# Patient Record
Sex: Female | Born: 1970 | Race: White | Hispanic: No | Marital: Married | Smoking: Never smoker
Health system: Southern US, Community
[De-identification: ages and names within clinical notes are randomized; demographics above are authoritative.]

## PROBLEM LIST (undated history)

## (undated) DIAGNOSIS — E669 Obesity, unspecified: Secondary | ICD-10-CM

## (undated) DIAGNOSIS — E039 Hypothyroidism, unspecified: Secondary | ICD-10-CM

## (undated) DIAGNOSIS — Z9889 Other specified postprocedural states: Secondary | ICD-10-CM

## (undated) DIAGNOSIS — Z789 Other specified health status: Secondary | ICD-10-CM

## (undated) DIAGNOSIS — G35 Multiple sclerosis: Secondary | ICD-10-CM

## (undated) HISTORY — PX: TUBAL LIGATION: SHX77

## (undated) HISTORY — PX: SHOULDER ARTHROSCOPY: SHX128

## (undated) HISTORY — PX: TONSILLECTOMY: SUR1361

## (undated) HISTORY — PX: BREAST BIOPSY: SHX20

## (undated) HISTORY — PX: THYROIDECTOMY: SHX17

---

## 2012-09-07 DIAGNOSIS — E041 Nontoxic single thyroid nodule: Secondary | ICD-10-CM | POA: Insufficient documentation

## 2012-10-04 DIAGNOSIS — R768 Other specified abnormal immunological findings in serum: Secondary | ICD-10-CM | POA: Insufficient documentation

## 2013-01-17 DIAGNOSIS — M792 Neuralgia and neuritis, unspecified: Secondary | ICD-10-CM | POA: Insufficient documentation

## 2013-07-11 DIAGNOSIS — N390 Urinary tract infection, site not specified: Secondary | ICD-10-CM | POA: Insufficient documentation

## 2013-07-11 DIAGNOSIS — N319 Neuromuscular dysfunction of bladder, unspecified: Secondary | ICD-10-CM | POA: Insufficient documentation

## 2013-07-11 HISTORY — DX: Urinary tract infection, site not specified: N39.0

## 2014-01-09 DIAGNOSIS — L80 Vitiligo: Secondary | ICD-10-CM | POA: Insufficient documentation

## 2014-05-21 DIAGNOSIS — J3801 Paralysis of vocal cords and larynx, unilateral: Secondary | ICD-10-CM | POA: Insufficient documentation

## 2015-07-22 DIAGNOSIS — R234 Changes in skin texture: Secondary | ICD-10-CM | POA: Insufficient documentation

## 2015-07-22 HISTORY — DX: Changes in skin texture: R23.4

## 2016-04-25 ENCOUNTER — Ambulatory Visit
Admission: EM | Admit: 2016-04-25 | Discharge: 2016-04-25 | Disposition: A | Discharge status: Home / Self Care | Payer: BLUE CROSS/BLUE SHIELD | Attending: Emergency Medicine | Admitting: Emergency Medicine

## 2016-04-25 DIAGNOSIS — J069 Acute upper respiratory infection, unspecified: Secondary | ICD-10-CM | POA: Diagnosis not present

## 2016-04-25 HISTORY — DX: Multiple sclerosis: G35

## 2016-04-25 HISTORY — DX: Hypothyroidism, unspecified: E03.9

## 2016-04-25 HISTORY — DX: Other specified postprocedural states: Z98.890

## 2016-04-25 HISTORY — DX: Obesity, unspecified: E66.9

## 2016-04-25 MED ORDER — AZITHROMYCIN 250 MG PO TABS: ORAL_TABLET | ORAL | 0 refills | Status: DC

## 2016-04-25 MED ORDER — BENZONATATE 200 MG PO CAPS: 200.0000 mg | ORAL_CAPSULE | Freq: Three times a day (TID) | ORAL | 0 refills | Status: DC

## 2016-04-25 NOTE — ED Provider Notes (Signed)
CSN: [id number]     Arrival date & time 04/25/16  0909 History   First MD Initiated Contact with Patient 04/25/16 1043     Chief Complaint  Patient presents with  . Cough  . Nasal Congestion  . Shortness of Breath   (Consider location/radiation/quality/duration/timing/severity/associated sxs/prior Treatment) HPI  This a 46 year old female who presents with a one-week history of cough and congestion. She denies any fever at all reaches subjective shortness of breath. O2 sats are 99% on room air. Respirations 20/m. Temperature 98 degrees pulse rate 89. History of multiple sclerosis A sem[facility] she has an illness she has normal increase in her muscle spasm and pain.      Past Medical History:  Diagnosis Date  . H/O shoulder surgery   . Hypothyroid   . Multiple sclerosis (HCC)   . Obese    Past Surgical History:  Procedure Laterality Date  . THYROIDECTOMY    . TONSILLECTOMY     History reviewed. No pertinent family history. Social History  Substance Use Topics  . Smoking status: Never Smoker  . Smokeless tobacco: Never Used  . Alcohol use Yes     Comment: social   OB History    No data available     Review of Systems  Constitutional: Positive for activity change and fatigue. Negative for chills and fever.  HENT: Positive for congestion, postnasal drip and rhinorrhea.   Respiratory: Positive for cough and shortness of breath. Negative for wheezing and stridor.   All other systems reviewed and are negative.   Allergies  Patient has no known allergies.  Home Medications   Prior to Admission medications   Medication Sig Start Date End Date Taking? Authorizing Provider  b complex vitamins capsule Take 1 capsule by mouth daily.   Yes Historical Provider, MD  calcium carbonate (OSCAL) 1500 (600 Ca) MG TABS tablet Take by mouth 2 (two) times daily with a meal.   Yes Historical Provider, MD  Cranberry 500 MG TABS Take by mouth.   Yes Historical Provider, MD  levothyroxine  (SYNTHROID, LEVOTHROID) 200 MCG tablet Take 200 mcg by mouth daily before breakfast.   Yes Historical Provider, MD  pregabalin (LYRICA) 150 MG capsule Take 150 mg by mouth 2 (two) times daily.   Yes Historical Provider, MD  vitamin C (ASCORBIC ACID) 500 MG tablet Take 500 mg by mouth daily.   Yes Historical Provider, MD  azithromycin (ZITHROMAX Z-PAK) 250 MG tablet Use as per package instructions 04/25/16   Lutricia Feil, PA-C  benzonatate (TESSALON) 200 MG capsule Take 1 capsule (200 mg total) by mouth every 8 (eight) hours. 04/25/16   Lutricia Feil, PA-C   Meds Ordered and Administered this Visit  Medications - No data to display  BP 128/71 (BP Location: Left Arm)   Pulse 89   Temp 98 F (36.7 C)   Resp 20   Ht 5\' 5"  (1.651 m)   Wt 270 lb (122.5 kg)   SpO2 99%   BMI 44.93 kg/m  No data found.   Physical Exam  Constitutional: She is oriented to person, place, and time. She appears well-developed and well-nourished. No distress.  HENT:  Head: Normocephalic and atraumatic.  Right Ear: External ear normal.  Left Ear: External ear normal.  Nose: Nose normal.  Mouth/Throat: Oropharynx is clear and moist. No oropharyngeal exudate.  Eyes: EOM are normal. Pupils are equal, round, and reactive to light. Right eye exhibits no discharge. Left eye exhibits no discharge.  Neck:  Normal range of motion. Neck supple.  Pulmonary/Chest: Effort normal and breath sounds normal. No respiratory distress. She has no wheezes. She has no rales.  Musculoskeletal: Normal range of motion.  Lymphadenopathy:    She has no cervical adenopathy.  Neurological: She is alert and oriented to person, place, and time.  Skin: Skin is warm and dry. She is not diaphoretic.  Psychiatric: She has a normal mood and affect. Her behavior is normal. Judgment and thought content normal.  Nursing note and vitals reviewed.   Urgent Care Course   Clinical Course     Procedures (including critical care time)  Labs  Review Labs Reviewed - No data to display  Imaging Review No results found.   Visual Acuity Review  Right Eye Distance:   Left Eye Distance:   Bilateral Distance:    Right Eye Near:   Left Eye Near:    Bilateral Near:         MDM   1. Upper respiratory tract infection, unspecified type    Discharge Medication List as of 04/25/2016 11:00 AM    START taking these medications   Details  azithromycin (ZITHROMAX Z-PAK) 250 MG tablet Use as per package instructions, Normal    benzonatate (TESSALON) 200 MG capsule Take 1 capsule (200 mg total) by mouth every 8 (eight) hours., Starting Sat 04/25/2016, Normal      Plan: 1. Test/x-ray results and diagnosis reviewed with patient 2. rx as per orders; risks, benefits, potential side effects reviewed with patient 3. Recommend supportive treatment with Fluids and rest as necessary. Use Motrin or Tylenol for aches and pains and fever. At the primary care physician if she is not improving 4. F/u prn if symptoms worsen or don't improve     Lutricia Feil, PA-C 04/25/16 1830

## 2016-04-25 NOTE — ED Triage Notes (Signed)
One week of cough and chest congestion. Denies fever. Pain in throat area today 3/10. Feels SOB but none noted in triage

## 2016-05-05 DIAGNOSIS — E890 Postprocedural hypothyroidism: Secondary | ICD-10-CM | POA: Insufficient documentation

## 2016-05-05 DIAGNOSIS — R32 Unspecified urinary incontinence: Secondary | ICD-10-CM | POA: Insufficient documentation

## 2016-05-05 HISTORY — DX: Unspecified urinary incontinence: R32

## 2018-01-03 ENCOUNTER — Other Ambulatory Visit: Payer: Self-pay | Admitting: Pediatrics

## 2018-01-03 ENCOUNTER — Ambulatory Visit
Admission: RE | Admit: 2018-01-03 | Discharge: 2018-01-03 | Disposition: A | Discharge status: Home / Self Care | Payer: BLUE CROSS/BLUE SHIELD | Source: Ambulatory Visit | Attending: Pediatrics | Admitting: Pediatrics

## 2018-01-03 DIAGNOSIS — Z9049 Acquired absence of other specified parts of digestive tract: Secondary | ICD-10-CM | POA: Insufficient documentation

## 2018-01-03 DIAGNOSIS — K5730 Diverticulosis of large intestine without perforation or abscess without bleeding: Secondary | ICD-10-CM | POA: Diagnosis not present

## 2018-01-03 DIAGNOSIS — R100 Acute abdomen: Secondary | ICD-10-CM

## 2018-01-03 DIAGNOSIS — R1011 Right upper quadrant pain: Secondary | ICD-10-CM | POA: Insufficient documentation

## 2018-01-03 MED ORDER — IOPAMIDOL (ISOVUE-300) INJECTION 61%
100.0000 mL | Freq: Once | INTRAVENOUS | Status: AC | PRN
  Administered 2018-01-03: 100 mL via INTRAVENOUS

## 2019-03-21 IMAGING — CT CT ABD-PELV W/ CM
2 of 5 series · 15 of 46 positions shown, 17 images · IV contrast (APPLIED)
Comparison: None.

CLINICAL DATA: RIGHT flank pain and abdominal pain for 4 days.
History of kidney stones.

EXAM:
CT ABDOMEN AND PELVIS WITH CONTRAST
TECHNIQUE: Multidetector CT imaging of the abdomen and pelvis was performed
using the standard protocol following bolus administration of
intravenous contrast.
CONTRAST:  100mL CCGIEJ-JNN IOPAMIDOL (CCGIEJ-JNN) INJECTION 61%

[Series 2: axial st · axial · 0.75mm/px · z∈[-437,+8]mm · 12 of 101 slices shown, 14 images]
[im 6/101  soft-tissue]
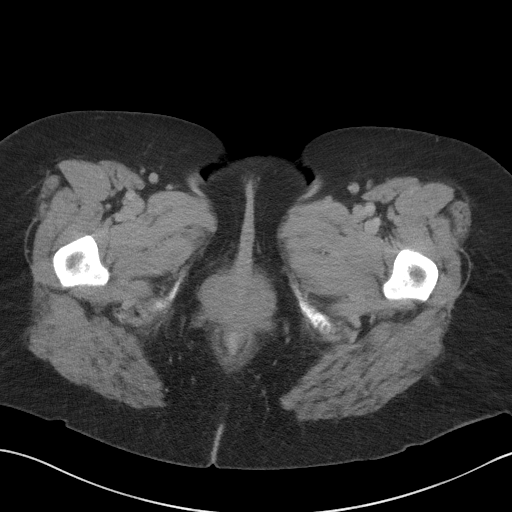
[im 6/101  bone]
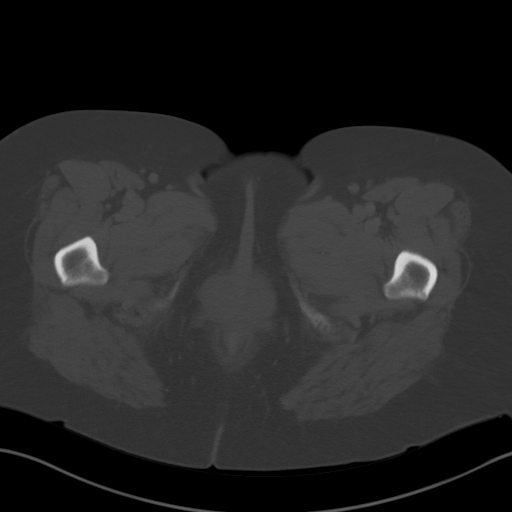
[im 17/101  soft-tissue]
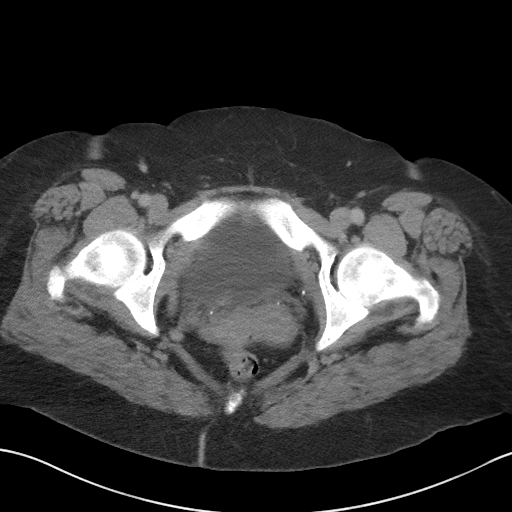
[im 23/101  soft-tissue]
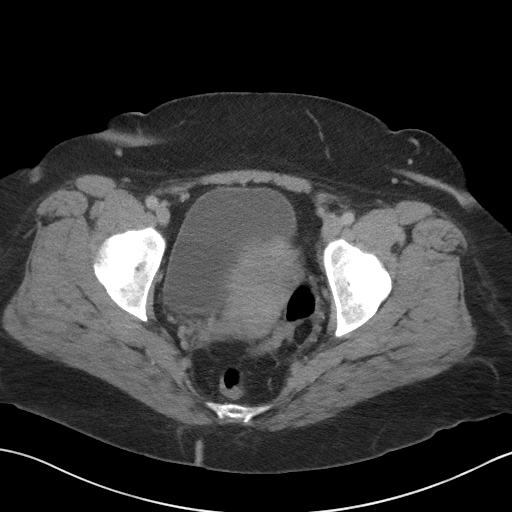
[im 28/101  soft-tissue]
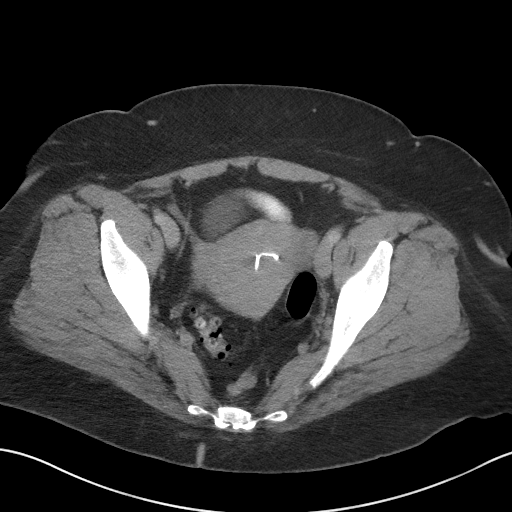
[im 39/101  soft-tissue]
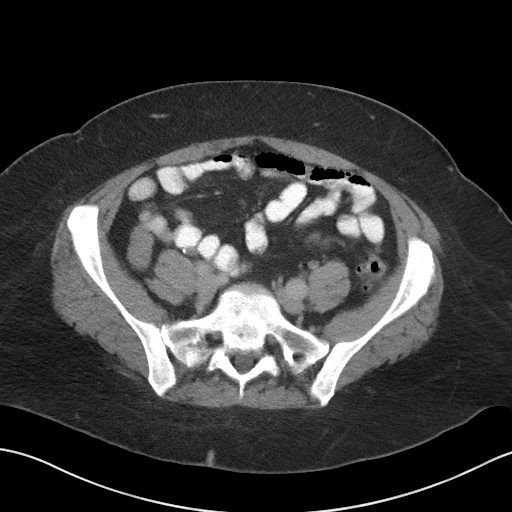
[im 45/101  soft-tissue]
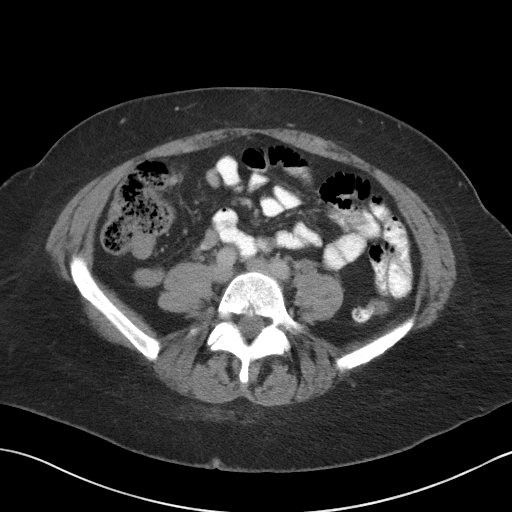
[im 56/101  soft-tissue]
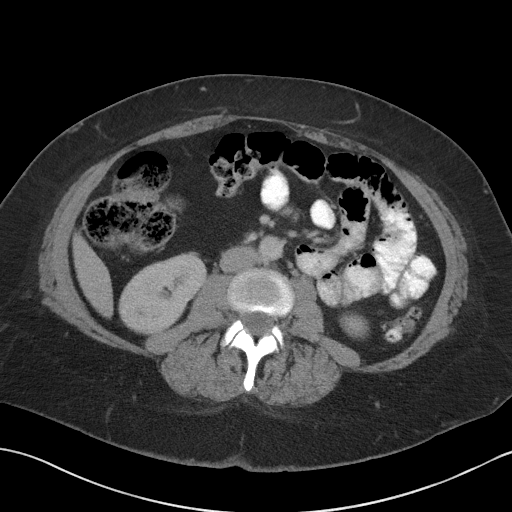
[im 62/101  soft-tissue]
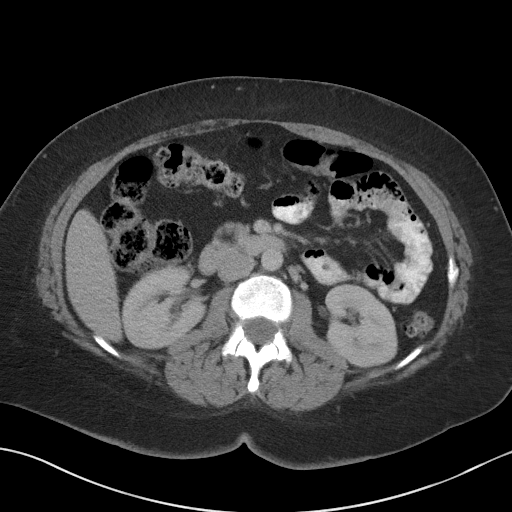
[im 73/101  soft-tissue]
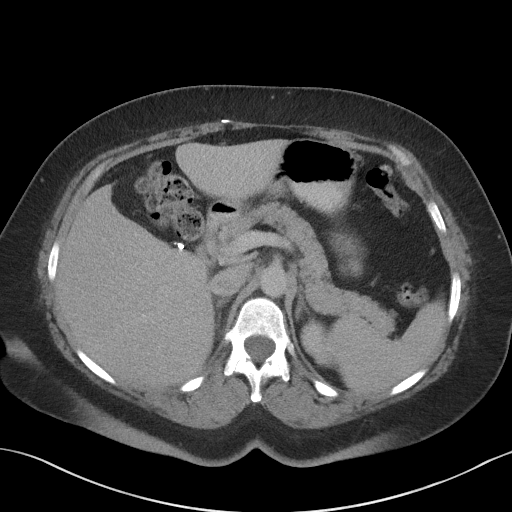
[im 73/101  bone]
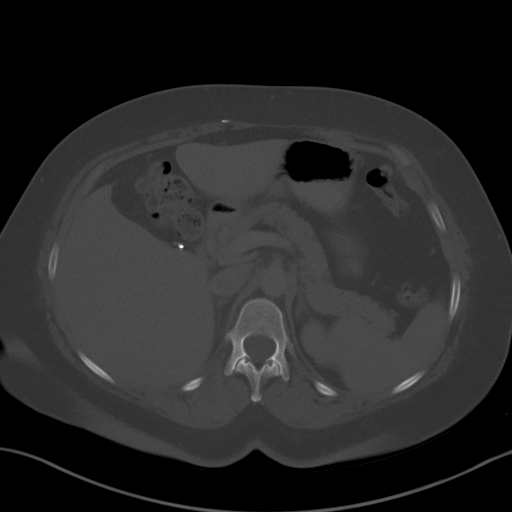
[im 78/101  soft-tissue]
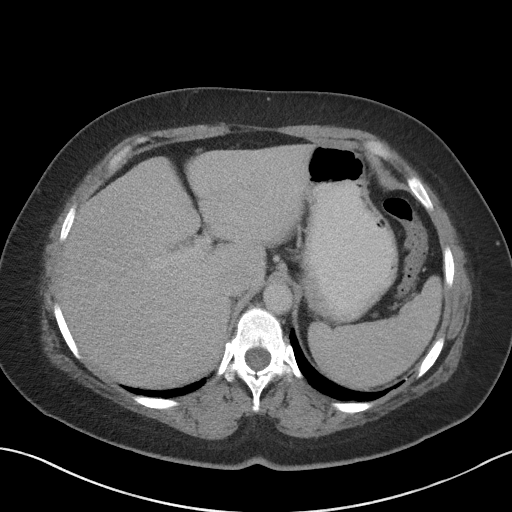
[im 84/101  soft-tissue]
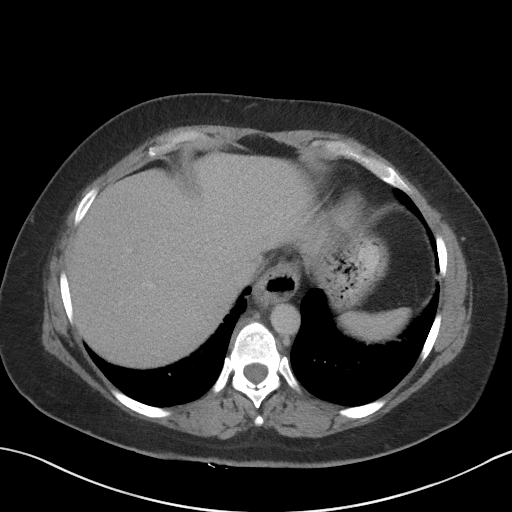
[im 95/101  soft-tissue]
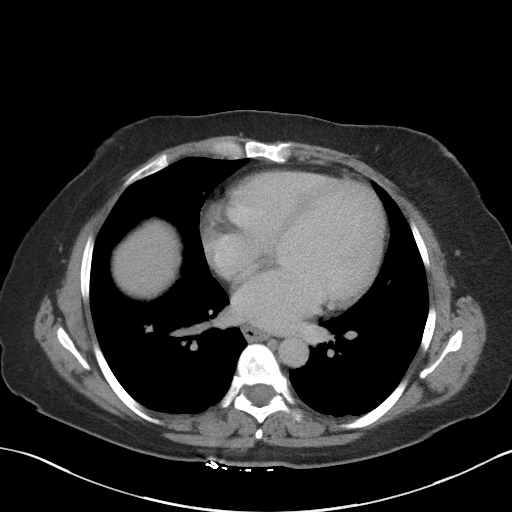

[Series 5: coronal st · coronal · 0.74mm/px · 3 of 85 slices shown]
[im 29/85  soft-tissue]
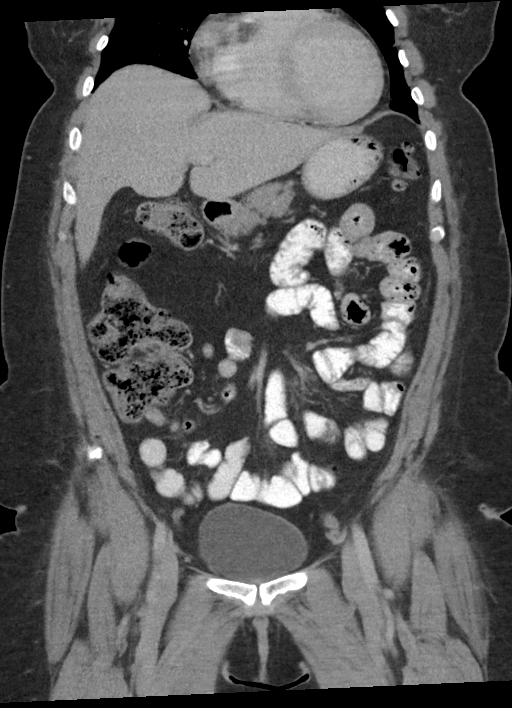
[im 38/85  soft-tissue]
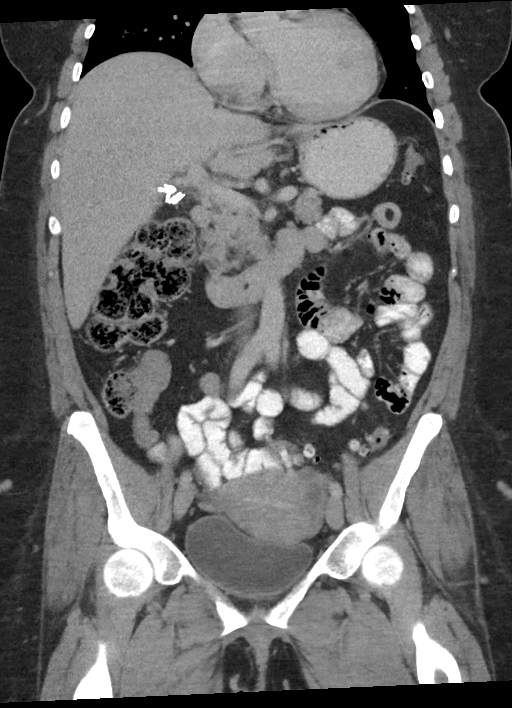
[im 47/85  soft-tissue]
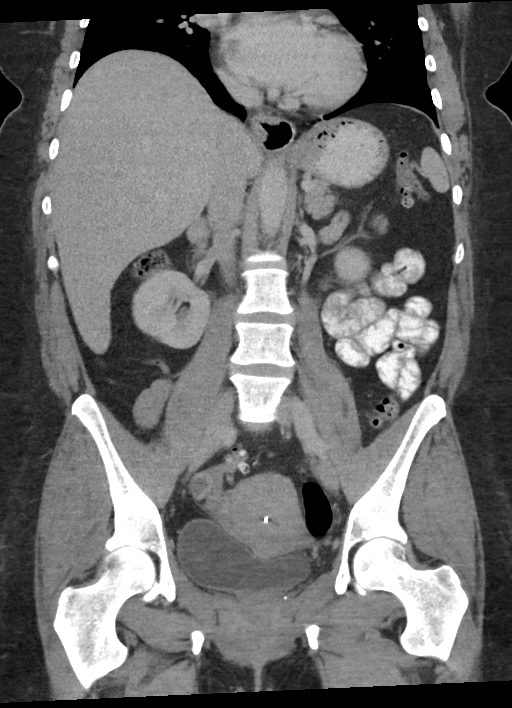

[15 of 46 positions shown; findings below may reference images not displayed]

FINDINGS: Lower chest: No acute abnormality.

Hepatobiliary: No focal liver abnormality is seen. Status post
cholecystectomy. No biliary dilatation.

Pancreas: Unremarkable. No pancreatic ductal dilatation or
surrounding inflammatory changes.

Spleen: Normal in size without focal abnormality.

Adrenals/Urinary Tract: Adrenal glands appear normal. Kidneys are
unremarkable without mass, stone or hydronephrosis. No perinephric
inflammation. No ureteral or bladder calculi identified. Bladder
appears normal, partially decompressed.

Stomach/Bowel: No dilated large or small bowel loops. Extensive
diverticulosis of the sigmoid and descending colon, with additional
scattered diverticulosis in the transverse colon, but no focal
inflammatory change to suggest acute diverticulitis. No bowel wall
thickening or evidence of bowel wall inflammation. Appendix is
normal. Stomach is unremarkable.

Vascular/Lymphatic: No significant vascular findings are present. No
enlarged abdominal or pelvic lymph nodes.

Reproductive: No acute findings. No adnexal mass or free fluid.
Intrauterine device appears appropriately positioned.

Other: No free fluid or abscess collection. No free intraperitoneal
air.

Musculoskeletal: No acute or suspicious osseous finding.
IMPRESSION: 1. No acute findings. No source for RIGHT flank pain identified. No
renal or ureteral calculi. No evidence of pyelonephritis. No bowel
obstruction. Appendix is normal.
2. Extensive colonic diverticulosis but no evidence of acute
diverticulitis.
3. Status post cholecystectomy.

## 2021-02-27 ENCOUNTER — Encounter: Payer: Self-pay | Admitting: Nurse Practitioner

## 2021-02-27 ENCOUNTER — Ambulatory Visit (INDEPENDENT_AMBULATORY_CARE_PROVIDER_SITE_OTHER): Payer: BLUE CROSS/BLUE SHIELD | Admitting: Nurse Practitioner

## 2021-02-27 ENCOUNTER — Other Ambulatory Visit: Payer: Self-pay

## 2021-02-27 VITALS — BP 114/80 | HR 62 | Temp 98.5°F | Ht 65.5 in | Wt 255.0 lb

## 2021-02-27 DIAGNOSIS — E661 Drug-induced obesity: Secondary | ICD-10-CM | POA: Diagnosis not present

## 2021-02-27 DIAGNOSIS — Z6841 Body Mass Index (BMI) 40.0 and over, adult: Secondary | ICD-10-CM | POA: Diagnosis not present

## 2021-02-27 DIAGNOSIS — Z1231 Encounter for screening mammogram for malignant neoplasm of breast: Secondary | ICD-10-CM

## 2021-02-27 DIAGNOSIS — Z23 Encounter for immunization: Secondary | ICD-10-CM | POA: Diagnosis not present

## 2021-02-27 DIAGNOSIS — G35 Multiple sclerosis: Secondary | ICD-10-CM | POA: Diagnosis not present

## 2021-02-27 DIAGNOSIS — Z7689 Persons encountering health services in other specified circumstances: Secondary | ICD-10-CM

## 2021-02-27 DIAGNOSIS — E039 Hypothyroidism, unspecified: Secondary | ICD-10-CM | POA: Diagnosis not present

## 2021-02-27 MED ORDER — LEVOTHYROXINE SODIUM 150 MCG PO TABS
150.0000 ug | ORAL_TABLET | Freq: Every day | ORAL | 2 refills | Status: DC
Start: 2021-02-27 — End: 2021-03-26

## 2021-02-27 NOTE — Progress Notes (Signed)
I,Katawbba Wiggins,acting as a Neurosurgeon for [employer], NP.,have documented all relevant documentation on the behalf of [employer], NP,as directed by  Charlesetta Ivory, NP while in the presence of Charlesetta Ivory, NP.  This visit occurred during the SARS-CoV-2 public health emergency.  Safety protocols were in place, including screening questions prior to the visit, additional usage of staff PPE, and extensive cleaning of exam room while observing appropriate contact time as indicated for disinfecting solutions.  Subjective:     Patient ID: Samantha [name] , female    DOB: 1970-10-03 , 50 y.o.   MRN: [id number]   Chief Complaint  Patient presents with   Establish Care    HPI  The patient is here today to establish care. She was seeing Eino Farber in [place]. She has MS and she is trying to find a new neurologist. She would like to get a referral. She also has hypothyroidism and takes lev 150 mcg.  She does not have a OBGYN. She does have a IUD.  She works at a hotel.      Past Medical History:  Diagnosis Date   H/O shoulder surgery    Hypothyroid    Multiple sclerosis (HCC)    Obese      Family History  Problem Relation Age of Onset   Cancer Mother      Current Outpatient Medications:    b complex vitamins capsule, Take 1 capsule by mouth daily., Disp: , Rfl:    baclofen (LIORESAL) 10 MG tablet, Take 10 mg by mouth 3 (three) times daily., Disp: , Rfl:    calcium carbonate (OSCAL) 1500 (600 Ca) MG TABS tablet, Take by mouth 2 (two) times daily with a meal., Disp: , Rfl:    Cranberry 500 MG TABS, Take by mouth., Disp: , Rfl:    Ocrelizumab (OCREVUS IV), Inject into the vein. Every 6 mths, Disp: , Rfl:    Oxcarbazepine (TRILEPTAL) 300 MG tablet, Take 300 mg by mouth 2 (two) times daily., Disp: , Rfl:    pregabalin (LYRICA) 150 MG capsule, Take 150 mg by mouth 2 (two) times daily., Disp: , Rfl:    tolterodine (DETROL) 2 MG tablet, Take 2 mg by mouth 2 (two)  times daily., Disp: , Rfl:    vitamin C (ASCORBIC ACID) 500 MG tablet, Take 500 mg by mouth daily., Disp: , Rfl:    azithromycin (ZITHROMAX Z-PAK) 250 MG tablet, Use as per package instructions (Patient not taking: Reported on 02/27/2021), Disp: 1 each, Rfl: 0   benzonatate (TESSALON) 200 MG capsule, Take 1 capsule (200 mg total) by mouth every 8 (eight) hours. (Patient not taking: Reported on 02/27/2021), Disp: 21 capsule, Rfl: 0   levothyroxine (SYNTHROID) 150 MCG tablet, Take 1 tablet (150 mcg total) by mouth daily before breakfast., Disp: 30 tablet, Rfl: 2   No Known Allergies   Review of Systems  Constitutional:  Negative for chills, fatigue and fever.  HENT:  Negative for rhinorrhea.   Respiratory:  Negative for cough, shortness of breath and wheezing.   Cardiovascular:  Negative for chest pain and palpitations.  Gastrointestinal:  Negative for abdominal pain, constipation, diarrhea and nausea.  Endocrine: Negative for polydipsia, polyphagia and polyuria.  Musculoskeletal:  Positive for joint swelling. Negative for arthralgias and myalgias.       Achy joints due to MS   Neurological:  Positive for weakness.    Today's Vitals   02/27/21 1454  BP: 114/80  Pulse: 62  Temp: 98.5 F (36.9  C)  Weight: 255 lb (115.7 kg)  Height: 5' 5.5" (1.664 m)   Body mass index is 41.79 kg/m.   Objective:  Physical Exam Constitutional:      Appearance: Normal appearance. She is obese.  HENT:     Head: Normocephalic and atraumatic.  Cardiovascular:     Rate and Rhythm: Normal rate and regular rhythm.     Pulses: Normal pulses.     Heart sounds: No murmur heard. Pulmonary:     Effort: Pulmonary effort is normal. No respiratory distress.     Breath sounds: Normal breath sounds. No wheezing.  Skin:    General: Skin is warm and dry.     Capillary Refill: Capillary refill takes less than 2 seconds.  Neurological:     Mental Status: She is alert.        Assessment And Plan:     1.  Encounter to establish care -She is here to establish care.  -She was seeing PCP in [place]  -She was also with [facility]   2. Multiple sclerosis (HCC) -She was seen by [facility] neurologist before but due to her insurance she has to see someone else. Referral put in.  - Ambulatory referral to Neurology  3. Hypothyroidism, unspecified type Currently taking levothyroxine (SYNTHROID) 150 MCG tablet; Take 1 tablet (150 mcg total) by mouth daily before breakfast.  Dispense: 30 tablet; Refill: 2 - Will check TSH + free T4 and assess.   4. Need for pneumococcal vaccine - Pneumococcal polysaccharide vaccine 23-valent greater than or equal to 2yo subcutaneous/IM  5. Screening mammogram for breast cancer - MM Digital Screening; Future  6. 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 Increase the intake of lean meats including grilled chicken and [place].  Drink a lot of water. Decrease intake of fatty foods. Exercise for 30-45 min. 4-5 a week to decrease the risk of cardiac event.   The patient was encouraged to call or send a message through MyChart for any questions or concerns.   Follow up: 2-3 months for physical exam   Side effects and appropriate use of all the medication(s) were discussed with the patient today. Patient advised to use the medication(s) as directed by their healthcare provider. The patient was encouraged to read, review, and understand all associated package inserts and contact our office with any questions or concerns. The patient accepts the risks of the treatment plan and had an opportunity to ask questions.   Staying healthy and adopting a healthy lifestyle for your overall health is important. You should eat 7 or more servings of fruits and vegetables per day. You should drink plenty of water to keep yourself hydrated and your kidneys  healthy. This includes about 65-80+ fluid ounces of water. Limit your intake of animal fats especially for elevated cholesterol. Avoid highly processed food and limit your salt intake if you have hypertension. Avoid foods high in saturated/Trans fats. Along with a healthy diet it is also very important to maintain time for yourself to maintain a healthy mental health with low stress levels. You should get atleast 150 min of moderate intensity exercise weekly for a healthy heart. Along with eating right and exercising, aim for at least 7-9 hours of sleep daily.  Eat more whole grains which includes barley, wheat berries, oats, brown rice and whole wheat pasta. Use healthy plant  oils which include olive, soy, corn, sunflower and peanut. Limit your caffeine and sugary drinks. Limit your intake of fast foods. Limit milk and dairy products to one or two daily servings.    Patient was given opportunity to ask questions. Patient verbalized understanding of the plan and was able to repeat key elements of the plan. All questions were answered to their satisfaction.  Raman Marce Schartz, DNP   I, Raman Cassiopeia Florentino have reviewed all documentation for this visit. The documentation on 02/27/21 for the exam, diagnosis, procedures, and orders are all accurate and complete.     IF YOU HAVE BEEN REFERRED TO A SPECIALIST, IT MAY TAKE 1-2 WEEKS TO SCHEDULE/PROCESS THE REFERRAL. IF YOU HAVE NOT HEARD FROM US/SPECIALIST IN TWO WEEKS, PLEASE GIVE [place] A CALL AT [phone].   THE PATIENT IS ENCOURAGED TO PRACTICE SOCIAL DISTANCING DUE TO THE COVID-19 PANDEMIC.

## 2021-02-28 LAB — TSH+FREE T4
Free T4: 1 ng/dL (ref 0.82–1.77)
TSH: 10.9 u[IU]/mL — ABNORMAL HIGH (ref 0.450–4.500)

## 2021-03-03 ENCOUNTER — Encounter: Payer: Self-pay | Admitting: Neurology

## 2021-03-04 ENCOUNTER — Other Ambulatory Visit: Payer: Self-pay | Admitting: Nurse Practitioner

## 2021-03-04 DIAGNOSIS — E039 Hypothyroidism, unspecified: Secondary | ICD-10-CM

## 2021-03-18 ENCOUNTER — Ambulatory Visit: Payer: 59 | Admitting: Neurology

## 2021-03-18 ENCOUNTER — Encounter: Payer: Self-pay | Admitting: Neurology

## 2021-03-18 VITALS — BP 128/72 | HR 44 | Ht 65.0 in | Wt 250.0 lb

## 2021-03-18 DIAGNOSIS — Z79899 Other long term (current) drug therapy: Secondary | ICD-10-CM | POA: Insufficient documentation

## 2021-03-18 DIAGNOSIS — R261 Paralytic gait: Secondary | ICD-10-CM

## 2021-03-18 DIAGNOSIS — R3915 Urgency of urination: Secondary | ICD-10-CM | POA: Diagnosis not present

## 2021-03-18 DIAGNOSIS — R208 Other disturbances of skin sensation: Secondary | ICD-10-CM | POA: Diagnosis not present

## 2021-03-18 DIAGNOSIS — G35 Multiple sclerosis: Secondary | ICD-10-CM

## 2021-03-18 DIAGNOSIS — G35A Relapsing-remitting multiple sclerosis: Secondary | ICD-10-CM | POA: Insufficient documentation

## 2021-03-18 MED ORDER — OXYBUTYNIN CHLORIDE ER 10 MG PO TB24: 10.0000 mg | ORAL_TABLET | Freq: Every day | ORAL | 3 refills | Status: DC

## 2021-03-18 MED ORDER — LAMOTRIGINE 25 MG PO TABS: ORAL_TABLET | ORAL | 5 refills | Status: DC

## 2021-03-18 NOTE — Progress Notes (Signed)
[facility]  PATIENT: Samantha [name] DOB: [birth year]  REFERRING DOCTOR OR PCP: Samantha Kluver, PA-C SOURCE: Patient, notes from [facility] (Dr. Elwyn [name]), imaging and laboratory reports, MRI images ere personally reviewed  _________________________________   HISTORICAL  CHIEF COMPLAINT:  Chief Complaint  Patient presents with   New Patient (Initial Visit)    Pt alone. Rm 1 She was diagnosed around 10 yrs ago and is here to establish care because insurance is making her change. She was going to [facility] and records are in care everywhere. She is currently on ocrevus. She has been having issues on right side with numbness. Usually she has problems with left side. She deals with neuropathic like pain she is on lyrica but doesn't feel that is helping.    Other    Receives Ocrevus infusion through [facility] and is due in Jan for next infusion. Will need new script sent in    HISTORY OF PRESENT ILLNESS:  I had the pleasure of seeing your patient, Samantha [name], at the [facility] at [facility] for neurologic consultation regarding her relapsing remitting multiple sclerosis  She is a 50 year old woman who was diagnosed with MS in 2012 after presenting with numbness.  She is currently on Ocrevus.  The last infusion July, 2022.  Next one will be late January 2023  MS history:  She was diagnosed with MS around 2012.   She woke up with numbness in her left face and tongue.    She had an MRI of the brain and was diagnosed with multiple sclerosis.   She was diagnosed in [place], [place].  She received IV steroids and numbness improved over time.    She was placed on Tecfidera x a couple years but had a relapse and then was placed on Plegridy.   She had difficulty with injections so switched to Ocrevus in 2019.   Her MS has done well and she has not had any exacerbations.     She is currently getting infusions  at [facility] in [facility], [place].     She has been seeing Dr. Elwyn [name] the past 3 years at [facility] but insurance company is making her switch.  Currently she is having dysesthesias.     She has pain in both legs like being stabbed.   Gabapentin had not helped    Lyrica dd not help much and oxcarbazepine was added 300 mg po bid (she could not tolerate 300 mg po tid).   She has le spasticity and is on baclofen.     She has muscle spasticity helped by baclofen.    Gait is doing well and she has mildly reduced balance.   She olds the bannister going downstairs.    She has urinary urgency, he  She has more stress (in [place] of divorcee).     No depression.   She sleeps poorly  (works nights).   Cognition is usually ok but has some word finding difficulties.    IMAGING: Personally reviewed today MRI of the brain 08/12/2020 shows multiple T2/Flair hyperintense foci in the periventrixular, juxtacotical and deep white matter and 2 foci in cerebellar hemisphere.   No enhancement    No change (by report) compared to 2020.  MRI of the cervical spine 03/17/2017 shows a T2 hyperintense focus at C2.   It was otherwise normal.  No enhancing lesions.  LABS: IgG was normal.  IgM borderline (57 at [facility]),  Vit D was normal.  QuantiFERON TB and hepatitis chronic infection labs were normal in 2017.  REVIEW OF SYSTEMS: Constitutional: No fevers, chills, sweats, or change in appetite Eyes: No visual changes, double vision, eye pain Ear, nose and throat: No hearing loss, ear pain, nasal congestion, sore throat Cardiovascular: No chest pain, palpitations Respiratory:  No shortness of breath at rest or with exertion.   No wheezes GastrointestinaI: No nausea, vomiting, diarrhea, abdominal pain, fecal incontinence Genitourinary:  No dysuria, urinary retention or frequency.  No nocturia. Musculoskeletal:  No neck pain, back pain Integumentary: No rash, pruritus, skin lesions Neurological: as above Psychiatric: No  depression at this time.  No anxiety Endocrine: No palpitations, diaphoresis, change in appetite, change in weigh or increased thirst Hematologic/Lymphatic:  No anemia, purpura, petechiae. Allergic/Immunologic: No itchy/runny eyes, nasal congestion, recent allergic reactions, rashes  ALLERGIES: No Known Allergies  HOME MEDICATIONS:  Current Outpatient Medications:    b complex vitamins capsule, Take 1 capsule by mouth daily., Disp: , Rfl:    baclofen (LIORESAL) 10 MG tablet, Take 10 mg by mouth 3 (three) times daily., Disp: , Rfl:    benzonatate (TESSALON) 200 MG capsule, Take 1 capsule (200 mg total) by mouth every 8 (eight) hours., Disp: 21 capsule, Rfl: 0   calcium carbonate (OSCAL) 1500 (600 Ca) MG TABS tablet, Take by mouth 2 (two) times daily with a meal., Disp: , Rfl:    Cranberry 500 MG TABS, Take by mouth., Disp: , Rfl:    lamoTRIgine (LAMICTAL) 25 MG tablet, Two po bid, Disp: 120 tablet, Rfl: 5   levothyroxine (SYNTHROID) 150 MCG tablet, Take 1 tablet (150 mcg total) by mouth daily before breakfast., Disp: 30 tablet, Rfl: 2   Ocrelizumab (OCREVUS IV), Inject into the vein. Every 6 mths, Disp: , Rfl:    oxybutynin (DITROPAN-XL) 10 MG 24 hr tablet, Take 1 tablet (10 mg total) by mouth at bedtime., Disp: 90 tablet, Rfl: 3   pregabalin (LYRICA) 150 MG capsule, Take 150 mg by mouth 2 (two) times daily., Disp: , Rfl:   PAST MEDICAL HISTORY: Past Medical History:  Diagnosis Date   H/O shoulder surgery    Hypothyroid    Multiple sclerosis (HCC)    Obese     PAST SURGICAL HISTORY: Past Surgical History:  Procedure Laterality Date   SHOULDER ARTHROSCOPY Left    THYROIDECTOMY     TONSILLECTOMY      FAMILY HISTORY: Family History  Problem Relation Age of Onset   Cancer Mother     SOCIAL HISTORY:  Social History   Socioeconomic History   Marital status: Married    Spouse name: Not on file   Number of children: Not on file   Years of education: Not on file   Highest  education level: Not on file  Occupational History   Not on file  Tobacco Use   Smoking status: Never   Smokeless tobacco: Never  Vaping Use   Vaping Use: Never used  Substance and Sexual Activity   Alcohol use: Yes    Comment: social   Drug use: No   Sexual activity: Not on file  Other Topics Concern   Not on file  Social History Narrative   Not on file   Social Determinants of Health   Financial Resource Strain: Not on file  Food Insecurity: Not on file  Transportation Needs: Not on file  Physical Activity: Not on file  Stress: Not on file  Social Connections: Not on file  Intimate Partner Violence: Not on file     PHYSICAL EXAM  Vitals:   03/18/21 1320  BP: 128/72  Pulse: (!) 44  Weight: 250 lb (113.4 kg)  Height: 5\' 5"  (1.651 m)    Body mass index is 41.6 kg/m.   General: The patient is well-developed and well-nourished and in no acute distress  HEENT:  Head is [place]/AT.  Sclera are anicteric.  Funduscopic exam shows normal optic discs and retinal vessels.  Neck: No carotid bruits are noted.  The neck is nontender.  Cardiovascular: The heart has a regular rate and rhythm with a normal S1 and S2. There were no murmurs, gallops or rubs.    Skin: Extremities are without rash or  edema.  Musculoskeletal:  Back is nontender  Neurologic Exam  Mental status: The patient is alert and oriented x 3 at the time of the examination. The patient has apparent normal recent and remote memory, with an apparently normal attention span and concentration ability.   Speech is normal.  Cranial nerves: Extraocular movements are full. Pupils are equal, round, and reactive to light and accomodation.  Colors are normal and symmetric.  .  Facial symmetry is present. There is good facial sensation to soft touch bilaterally.Facial strength is normal.  Trapezius and sternocleidomastoid strength is normal. No dysarthria is noted.  The tongue is midline, and the patient has symmetric  elevation of the soft palate. No obvious hearing deficits are noted.  Motor:  Muscle bulk is normal.   Tone is slightly increased in legs.. Strength is  5 / 5 in all 4 extremities.   Sensory: Sensory testing is intact to pinprick, soft touch and vibration sensation in all 4 extremities.  Coordination: Cerebellar testing reveals good finger-nose-finger and heel-to-shin bilaterally.  Gait and station: Station is normal.   Gait is spastic and mildly wide. Tandem gait is wide.  . Romberg is negative.   Reflexes: Deep tendon reflexes are symmetric and normal bilaterally.   Plantar responses are flexor.    Lab Results  Component Value Date   TSH 10.900 (H) 02/27/2021       ASSESSMENT AND PLAN  Multiple sclerosis (HCC)  High risk medication use  Dysesthesia  Urinary urgency  Spastic gait   In summary, Ms. Quizon is a 50 year old woman who was diagnosed with MS in 2012 and is currently on Ocrevus therapy.  She has done well on Ocrevus with no breakthrough activity on MRI and no clinical exacerbations.  Therefore, we will continue the medication.  She is going to have blood work with primary care next week.  I wrote out a request to check chronic hepatitis labs as well as 2013 TB as these have not been checked since 2017.  She signed a new service request form so that we can initiate the process to get her infusions.  Hopefully she will be able to get infusions in 2018 since it is much closer than the city of [facility] where infusions are currently being done.  To help with dysesthesias I will start lamotrigine and titrate up to 50 mg twice a day.  We will titrate further if well-tolerated depending on the response.  She has urinary urgency and I have started oxybutynin, switching from Detrol LA as it is much more affordable for her.  She will return to see me in 6 months or sooner if there are new or worsening neurologic symptoms.  Thank you for asking me to see Ms.  Zappone.  Please let me know if I can be of further assistance with her or other patients in the future.   Konstantine Gervasi A. Epimenio Foot, MD, [facility] 03/18/2021, 6:55 PM Certified in Neurology, Clinical Neurophysiology, Sleep Medicine and Neuroimaging  [facility] [address] [place], [place] [address] [phone]

## 2021-03-18 NOTE — Patient Instructions (Signed)
LAMOTRIGINE For 1 week take 1 pill once a day. The second week, take 1 pill twice a day. The third week, take 1 pill in the morning and 2 at bedtime or evening. The fourth week take 2 pills twice a day.  Most people tolerate lamotrigine very well. Some will get a rash. If you do get a significant rash stop the medicine immediately and let us know.

## 2021-03-19 ENCOUNTER — Ambulatory Visit: Payer: 59 | Admitting: Nurse Practitioner

## 2021-03-19 ENCOUNTER — Other Ambulatory Visit: Payer: Self-pay

## 2021-03-19 ENCOUNTER — Encounter: Payer: Self-pay | Admitting: Nurse Practitioner

## 2021-03-19 VITALS — BP 118/70 | HR 66 | Temp 98.7°F | Ht 66.6 in | Wt 251.8 lb

## 2021-03-19 DIAGNOSIS — E6609 Other obesity due to excess calories: Secondary | ICD-10-CM

## 2021-03-19 DIAGNOSIS — Z6839 Body mass index (BMI) 39.0-39.9, adult: Secondary | ICD-10-CM | POA: Diagnosis not present

## 2021-03-19 DIAGNOSIS — H00015 Hordeolum externum left lower eyelid: Secondary | ICD-10-CM

## 2021-03-19 MED ORDER — ERYTHROMYCIN 5 MG/GM OP OINT: 1.0000 "application " | TOPICAL_OINTMENT | Freq: Every day | OPHTHALMIC | 0 refills | Status: DC

## 2021-03-19 NOTE — Progress Notes (Signed)
I,[name],acting as a Education administrator for [employer], NP.,have documented all relevant documentation on the behalf of [employer], NP,as directed by  Bary Castilla, NP while in the presence of Bary Castilla, NP.  This visit occurred during the SARS-CoV-2 public health emergency.  Safety protocols were in place, including screening questions prior to the visit, additional usage of staff PPE, and extensive cleaning of exam room while observing appropriate contact time as indicated for disinfecting solutions.  Subjective:     Samantha [name] ID: Samantha [name] , female    DOB: [birth year] , 50 y.o.   MRN: [id number]   Chief Complaint  Samantha [name] presents with   eye concerns    HPI  Samantha [name] presents today for some eye concerns.  Samantha [name] noticed it 1 x week ago. No fever, cough, or congestion. No vision loss. Samantha [name] has tried warm compression but that does not seem to help.     Past Medical History:  Diagnosis Date   H/O shoulder surgery    Hypothyroid    Multiple sclerosis ([place])    Obese      Family History  Problem Relation Age of Onset   Cancer Mother      Current Outpatient Medications:    erythromycin ophthalmic ointment, Place 1 application into the left eye at bedtime., Disp: 3.5 g, Rfl: 0   b complex vitamins capsule, Take 1 capsule by mouth daily., Disp: , Rfl:    baclofen (LIORESAL) 10 MG tablet, Take 10 mg by mouth 3 (three) times daily., Disp: , Rfl:    benzonatate (TESSALON) 200 MG capsule, Take 1 capsule (200 mg total) by mouth every 8 (eight) hours., Disp: 21 capsule, Rfl: 0   calcium carbonate (OSCAL) 1500 (600 Ca) MG TABS tablet, Take by mouth 2 (two) times daily with a meal., Disp: , Rfl:    Cranberry 500 MG TABS, Take by mouth., Disp: , Rfl:    lamoTRIgine (LAMICTAL) 25 MG tablet, Two po bid, Disp: 120 tablet, Rfl: 5   levothyroxine (SYNTHROID) 150 MCG tablet, Take 1 tablet (150 mcg total) by mouth daily before breakfast., Disp: 30 tablet, Rfl: 2    Ocrelizumab (OCREVUS IV), Inject into the vein. Every 6 mths, Disp: , Rfl:    oxybutynin (DITROPAN-XL) 10 MG 24 hr tablet, Take 1 tablet (10 mg total) by mouth at bedtime., Disp: 90 tablet, Rfl: 3   pregabalin (LYRICA) 150 MG capsule, Take 150 mg by mouth 2 (two) times daily., Disp: , Rfl:    No Known Allergies   Review of Systems  Constitutional: Negative.  Negative for chills and fever.  HENT:  Negative for congestion.   Eyes:  Positive for pain. Negative for discharge, redness and visual disturbance.  Respiratory: Negative.  Negative for shortness of breath and wheezing.   Cardiovascular: Negative.  Negative for chest pain and palpitations.  Gastrointestinal: Negative.  Negative for constipation, diarrhea and vomiting.  Neurological: Negative.  Negative for dizziness, weakness and numbness.    Today's Vitals   03/19/21 1025  BP: 118/70  Pulse: 66  Temp: 98.7 F (37.1 C)  TempSrc: Oral  Weight: 251 lb 12.8 oz (114.2 kg)  Height: 5' 6.6" (1.692 m)   Body mass index is 39.91 kg/m.  Wt Readings from Last 3 Encounters:  03/19/21 251 lb 12.8 oz (114.2 kg)  03/18/21 250 lb (113.4 kg)  02/27/21 255 lb (115.7 kg)    Objective:  Physical Exam Constitutional:      Appearance: Normal appearance. Samantha [name] is obese.  HENT:     Head: Normocephalic and atraumatic.  Eyes:     General: Vision grossly intact.        Left eye: Hordeolum present.    Extraocular Movements: Extraocular movements intact.     Conjunctiva/sclera: Conjunctivae normal.     Right eye: No exudate.    Left eye: No exudate.     Comments: Hordeolum presented without drianage on the bottom left lid   Cardiovascular:     Rate and Rhythm: Normal rate and regular rhythm.     Pulses: Normal pulses.     Heart sounds: Normal heart sounds. No murmur heard. Pulmonary:     Effort: Pulmonary effort is normal. No respiratory distress.     Breath sounds: Normal breath sounds. No wheezing.  Skin:    General: Skin is warm and  dry.     Capillary Refill: Capillary refill takes less than 2 seconds.  Neurological:     Mental Status: Samantha [name] is alert.        Assessment And Plan:     1. Hordeolum externum of left lower eyelid - erythromycin ophthalmic ointment; Place 1 application into the left eye at bedtime.  Dispense: 3.5 g; Refill: 0 -Advised Samantha [name] to continue to use warm compresses daily  -Advised Samantha [name] if the hordeolum does not resolve or if Samantha [name] expresses any further pain, irritation or trouble with Samantha [name] vision to go to the emergency room -Will put in referral to ophthalmologist if does not resolve or worsens.   2. Class 2 obesity due to excess calories without serious comorbidity with body mass index (BMI) of 39.0 to 39.9 in adult  Advised Samantha [name] on a healthy diet including avoiding fast food and red meats. Increase the intake of lean meats including grilled chicken and [place].  Drink a lot of water. Decrease intake of fatty foods. Exercise for 30-45 min. 4-5 a week to decrease the risk of cardiac event.   The Samantha [name] was encouraged to call or send a message through MyChart for any questions or concerns.   Follow up: if symptoms persist or do not get better.   Side effects and appropriate use of all the medication(s) were discussed with the Samantha [name] today. Samantha [name] advised to use the medication(s) as directed by their healthcare provider. The Samantha [name] was encouraged to read, review, and understand all associated package inserts and contact our office with any questions or concerns. The Samantha [name] accepts the risks of the treatment plan and had an opportunity to ask questions.   Samantha [name] was given opportunity to ask questions. Samantha [name] verbalized understanding of the plan and was able to repeat key elements of the plan. All questions were answered to their satisfaction.  Raman Eriyana Sweeten, DNP   I, Raman Oretta Berkland have reviewed all documentation for this visit. The documentation on 03/19/21 for the exam, diagnosis,  procedures, and orders are all accurate and complete.    IF YOU HAVE BEEN REFERRED TO A SPECIALIST, IT MAY TAKE 1-2 WEEKS TO SCHEDULE/PROCESS THE REFERRAL. IF YOU HAVE NOT HEARD FROM US/SPECIALIST IN TWO WEEKS, PLEASE GIVE [place] A CALL AT [phone].   THE Samantha [name] IS ENCOURAGED TO PRACTICE SOCIAL DISTANCING DUE TO THE COVID-19 PANDEMIC.

## 2021-03-19 NOTE — Patient Instructions (Signed)
Stye A stye, also known as a hordeolum, is a bump that forms on an eyelid. It may look like a pimple next to the eyelash. A stye can form inside the eyelid (internal stye) or outside the eyelid (external stye). A stye can cause redness, swelling, and pain on the eyelid. Styes are very common. Anyone can get them at any age. They usually occur in just one eye at a time, but you may have more than one in either eye. What are the causes? A stye is caused by an infection. The infection is almost always caused by bacteria called Staphylococcus aureus. This is a common type of bacteria that lives on the skin. An internal stye may result from an infected oil-producing gland inside the eyelid. An external stye may be caused by an infection at the base of the eyelash (hair follicle). What increases the risk? You are more likely to develop a stye if: You have had a stye before. You have any of these conditions: Red, itchy, inflamed eyelids (blepharitis). A skin condition such as seborrheic dermatitis or rosacea. High fat levels in your blood (lipids). Dry eyes. What are the signs or symptoms? The most common symptom of a stye is eyelid pain. Internal styes are more painful than external styes. Other symptoms may include: Painful swelling of your eyelid. A scratchy feeling in your eye. Tearing and redness of your eye. A pimple-like bump on the edge of the eyelid. Pus draining from the stye. How is this diagnosed? Your health care provider may be able to diagnose a stye just by examining your eye. The health care provider may also check to make sure: You do not have a fever or other signs of a more serious infection. The infection has not spread to other parts of your eye or areas around your eye. How is this treated? Most styes will clear up in a few days without treatment or with warm compresses applied to the area. You may need to use antibiotic drops or ointment to treat an infection. Sometimes,  steroid drops or ointment are used in addition to antibiotics. In some cases, your health care provider may give you a small steroid injection in the eyelid. If your stye does not heal with routine treatment, your health care provider may drain pus from the stye using a thin blade or needle. This may be done if the stye is large, causing a lot of pain, or affecting your vision. Follow these instructions at home: Take over-the-counter and prescription medicines only as told by your health care provider. This includes eye drops or ointments. If you were prescribed an antibiotic medicine, steroid medicine, or both, apply or use them as told by your health care provider. Do not stop using the medicine even if your condition improves. Apply a warm, wet cloth (warm compress) to your eye for 5-10 minutes, 4 to 6 times a day. Clean the affected eyelid as directed by your health care provider. Do not wear contact lenses or eye makeup until your stye has healed and your health care provider says that it is safe. Do not try to pop or drain the stye. Do not rub your eye. Contact a health care provider if: You have chills or a fever. Your stye does not go away after several days. Your stye affects your vision. Your eyeball becomes swollen, red, or painful. Get help right away if: You have pain when moving your eye around. Summary A stye is a bump that forms   on an eyelid. It may look like a pimple next to the eyelash. A stye can form inside the eyelid (internal stye) or outside the eyelid (external stye). A stye can cause redness, swelling, and pain on the eyelid. Your health care provider may be able to diagnose a stye just by examining your eye. Apply a warm, wet cloth (warm compress) to your eye for 5-10 minutes, 4 to 6 times a day. This information is not intended to replace advice given to you by your health care provider. Make sure you discuss any questions you have with your health care  provider. Document Revised: 06/05/2020 Document Reviewed: 06/05/2020 Elsevier Patient Education  2022 Elsevier Inc.  

## 2021-03-20 ENCOUNTER — Telehealth: Payer: Self-pay

## 2021-03-20 NOTE — Telephone Encounter (Signed)
Received Ocrevus start form from Dr. Epimenio Foot. Patient is due for her next Ocrevus infusion in the second half of January 2023. Start form faxed to [facility]. Received a receipt of confirmation.  Ocrevus order signed by Dr. Epimenio Foot.  Ocrevus order & start form given to infusion suite for processing.

## 2021-03-25 ENCOUNTER — Other Ambulatory Visit: Payer: Self-pay | Admitting: Nurse Practitioner

## 2021-03-25 ENCOUNTER — Other Ambulatory Visit: Payer: 59

## 2021-03-25 ENCOUNTER — Other Ambulatory Visit: Payer: Self-pay

## 2021-03-25 DIAGNOSIS — Z117 Encounter for testing for latent tuberculosis infection: Secondary | ICD-10-CM

## 2021-03-25 DIAGNOSIS — Z1159 Encounter for screening for other viral diseases: Secondary | ICD-10-CM

## 2021-03-25 DIAGNOSIS — E039 Hypothyroidism, unspecified: Secondary | ICD-10-CM

## 2021-03-25 DIAGNOSIS — Z1231 Encounter for screening mammogram for malignant neoplasm of breast: Secondary | ICD-10-CM

## 2021-03-26 ENCOUNTER — Other Ambulatory Visit: Payer: Self-pay | Admitting: Nurse Practitioner

## 2021-03-26 DIAGNOSIS — E039 Hypothyroidism, unspecified: Secondary | ICD-10-CM

## 2021-03-26 LAB — TSH+FREE T4
Free T4: 1.11 ng/dL (ref 0.82–1.77)
TSH: 8.67 u[IU]/mL — ABNORMAL HIGH (ref 0.450–4.500)

## 2021-03-26 MED ORDER — LEVOTHYROXINE SODIUM 175 MCG PO TABS: 175.0000 ug | ORAL_TABLET | Freq: Every day | ORAL | 1 refills | Status: DC

## 2021-03-26 NOTE — Progress Notes (Signed)
Please have her come in 4 weeks for TSH+T4 labs. Order already in.

## 2021-03-28 LAB — QUANTIFERON-TB GOLD PLUS
QuantiFERON Mitogen Value: 2.26 IU/mL
QuantiFERON Nil Value: 0 IU/mL
QuantiFERON TB1 Ag Value: 0 IU/mL
QuantiFERON TB2 Ag Value: 0.01 IU/mL
QuantiFERON-TB Gold Plus: NEGATIVE

## 2021-03-28 LAB — HEPATITIS B SURFACE ANTIGEN: Hepatitis B Surface Ag: NEGATIVE

## 2021-03-28 LAB — HEPATITIS B CORE ANTIBODY, IGM: Hep B C IgM: NEGATIVE

## 2021-03-28 LAB — HEPATITIS B SURFACE ANTIBODY,QUALITATIVE: Hep B Surface Ab, Qual: NONREACTIVE

## 2021-04-01 ENCOUNTER — Encounter: Payer: Self-pay | Admitting: Neurology

## 2021-04-08 ENCOUNTER — Other Ambulatory Visit: Payer: Self-pay | Admitting: Neurology

## 2021-04-08 MED ORDER — NORTRIPTYLINE HCL 25 MG PO CAPS: 25.0000 mg | ORAL_CAPSULE | Freq: Every day | ORAL | 3 refills | Status: DC

## 2021-04-15 ENCOUNTER — Ambulatory Visit: Payer: BLUE CROSS/BLUE SHIELD | Admitting: Neurology

## 2021-04-18 ENCOUNTER — Other Ambulatory Visit: Payer: Self-pay | Admitting: Nurse Practitioner

## 2021-04-18 DIAGNOSIS — E039 Hypothyroidism, unspecified: Secondary | ICD-10-CM

## 2021-04-24 ENCOUNTER — Other Ambulatory Visit: Payer: Self-pay

## 2021-04-24 ENCOUNTER — Other Ambulatory Visit: Payer: 59

## 2021-04-24 DIAGNOSIS — E039 Hypothyroidism, unspecified: Secondary | ICD-10-CM

## 2021-04-25 LAB — TSH+FREE T4
Free T4: 1.55 ng/dL (ref 0.82–1.77)
TSH: 4.32 u[IU]/mL (ref 0.450–4.500)

## 2021-05-01 ENCOUNTER — Other Ambulatory Visit: Payer: Self-pay | Admitting: Neurology

## 2021-05-27 ENCOUNTER — Encounter: Payer: Self-pay | Admitting: Nurse Practitioner

## 2021-05-28 ENCOUNTER — Other Ambulatory Visit: Payer: Self-pay

## 2021-05-28 DIAGNOSIS — E039 Hypothyroidism, unspecified: Secondary | ICD-10-CM

## 2021-05-28 MED ORDER — LEVOTHYROXINE SODIUM 175 MCG PO TABS: 175.0000 ug | ORAL_TABLET | Freq: Every day | ORAL | 1 refills | Status: DC

## 2021-06-05 ENCOUNTER — Other Ambulatory Visit: Payer: Self-pay

## 2021-06-05 ENCOUNTER — Encounter: Payer: Self-pay | Admitting: Nurse Practitioner

## 2021-06-05 ENCOUNTER — Ambulatory Visit (INDEPENDENT_AMBULATORY_CARE_PROVIDER_SITE_OTHER): Payer: 59 | Admitting: Nurse Practitioner

## 2021-06-05 VITALS — BP 128/70 | HR 94 | Temp 98.4°F | Ht 66.2 in | Wt 248.0 lb

## 2021-06-05 DIAGNOSIS — Z114 Encounter for screening for human immunodeficiency virus [HIV]: Secondary | ICD-10-CM

## 2021-06-05 DIAGNOSIS — Z0000 Encounter for general adult medical examination without abnormal findings: Secondary | ICD-10-CM

## 2021-06-05 DIAGNOSIS — Z6839 Body mass index (BMI) 39.0-39.9, adult: Secondary | ICD-10-CM

## 2021-06-05 DIAGNOSIS — Z1211 Encounter for screening for malignant neoplasm of colon: Secondary | ICD-10-CM

## 2021-06-05 DIAGNOSIS — Z01419 Encounter for gynecological examination (general) (routine) without abnormal findings: Secondary | ICD-10-CM

## 2021-06-05 DIAGNOSIS — E6609 Other obesity due to excess calories: Secondary | ICD-10-CM | POA: Diagnosis not present

## 2021-06-05 DIAGNOSIS — E039 Hypothyroidism, unspecified: Secondary | ICD-10-CM

## 2021-06-05 DIAGNOSIS — Z124 Encounter for screening for malignant neoplasm of cervix: Secondary | ICD-10-CM

## 2021-06-05 DIAGNOSIS — G35 Multiple sclerosis: Secondary | ICD-10-CM

## 2021-06-05 DIAGNOSIS — Z79899 Other long term (current) drug therapy: Secondary | ICD-10-CM

## 2021-06-05 NOTE — Progress Notes (Signed)
I,Samantha [name],acting as a Education administrator for [employer], FNP.,have documented all relevant documentation on the behalf of Samantha Brine, FNP,as directed by  Samantha Brine, FNP while in the presence of Samantha [name], [place].  This visit occurred during the SARS-CoV-2 public health emergency.  Safety protocols were in place, including screening questions prior to the visit, additional usage of staff PPE, and extensive cleaning of exam room while observing appropriate contact time as indicated for disinfecting solutions.  Subjective:     Patient ID: Samantha [name] , female    DOB: 1971/01/05 , 51 y.o.   MRN: [id number]   Chief Complaint  Patient presents with   Annual Exam    HPI  Patient is here for HM. She has seen Dr Samantha [name] to get established with neuro for her MS.  She has an IUD due to having bad menstrual cycles that worsened her MS, also has her tubes tied.     Past Medical History:  Diagnosis Date   H/O shoulder surgery    Hypothyroid    Multiple sclerosis ([place])    Obese      Family History  Problem Relation Age of Onset   Cancer Mother      Current Outpatient Medications:    b complex vitamins capsule, Take 1 capsule by mouth daily., Disp: , Rfl:    baclofen (LIORESAL) 10 MG tablet, Take 10 mg by mouth 3 (three) times daily., Disp: , Rfl:    benzonatate (TESSALON) 200 MG capsule, Take 1 capsule (200 mg total) by mouth every 8 (eight) hours., Disp: 21 capsule, Rfl: 0   calcium carbonate (OSCAL) 1500 (600 Ca) MG TABS tablet, Take by mouth 2 (two) times daily with a meal., Disp: , Rfl:    Cranberry 500 MG TABS, Take by mouth., Disp: , Rfl:    levothyroxine (SYNTHROID) 175 MCG tablet, Take 1 tablet (175 mcg total) by mouth daily before breakfast., Disp: 90 tablet, Rfl: 1   nortriptyline (PAMELOR) 25 MG capsule, TAKE 1 CAPSULE BY MOUTH AT BEDTIME., Disp: 90 capsule, Rfl: 1   Ocrelizumab (OCREVUS IV), Inject into the vein. Every 6 mths, Disp: , Rfl:    oxybutynin (DITROPAN-XL)  10 MG 24 hr tablet, Take 1 tablet (10 mg total) by mouth at bedtime., Disp: 90 tablet, Rfl: 3   pregabalin (LYRICA) 150 MG capsule, Take 150 mg by mouth 2 (two) times daily., Disp: , Rfl:    No Known Allergies    The patient states she uses IUD for birth control.  No LMP recorded. (Menstrual status: IUD).. Negative for Dysmenorrhea. Negative for: breast discharge, breast lump(s), breast pain and breast self exam. Associated symptoms include abnormal vaginal bleeding. Pertinent negatives include abnormal bleeding (hematology), anxiety, decreased libido, depression, difficulty falling sleep, dyspareunia, history of infertility, nocturia, sexual dysfunction, sleep disturbances, urinary incontinence, urinary urgency, vaginal discharge and vaginal itching. Diet regular. She works 3rd shift. The patient states her exercise level is minimal 1 day a week. Reports she is too tired.   The patient's tobacco use is:  Social History   Tobacco Use  Smoking Status Never  Smokeless Tobacco Never   She has been exposed to passive smoke. The patient's alcohol use is:  Social History   Substance and Sexual Activity  Alcohol Use Yes   Comment: social   Additional information: Last pap 2019, next one scheduled for 2022.    Review of Systems  Constitutional: Negative.   HENT: Negative.    Eyes: Negative.   Respiratory: Negative.  Cardiovascular: Negative.   Gastrointestinal: Negative.   Endocrine: Negative.   Genitourinary: Negative.   Musculoskeletal: Negative.   Skin: Negative.   Allergic/Immunologic: Negative.   Neurological: Negative.   Hematological: Negative.   Psychiatric/Behavioral: Negative.      Today's Vitals   06/05/21 1500  BP: 128/70  Pulse: 94  Temp: 98.4 F (36.9 C)  TempSrc: Oral  Weight: 248 lb (112.5 kg)  Height: 5' 6.2" (1.681 m)   Body mass index is 39.79 kg/m.  Wt Readings from Last 3 Encounters:  06/05/21 248 lb (112.5 kg)  03/19/21 251 lb 12.8 oz (114.2 kg)   03/18/21 250 lb (113.4 kg)    Objective:  Physical Exam Vitals reviewed.  Constitutional:      General: She is not in acute distress.    Appearance: Normal appearance. She is well-developed. She is obese.  HENT:     Head: Normocephalic and atraumatic.     Right Ear: Hearing, tympanic membrane, ear canal and external ear normal. There is no impacted cerumen.     Left Ear: Hearing, tympanic membrane, ear canal and external ear normal. There is no impacted cerumen.     Nose:     Comments: Deferred - masked    Mouth/Throat:     Comments: Deferred - masked Eyes:     General: Lids are normal.     Extraocular Movements: Extraocular movements intact.     Conjunctiva/sclera: Conjunctivae normal.     Pupils: Pupils are equal, round, and reactive to light.     Funduscopic exam:    Right eye: No papilledema.        Left eye: No papilledema.  Neck:     Thyroid: No thyroid mass.     Vascular: No carotid bruit.  Cardiovascular:     Rate and Rhythm: Normal rate and regular rhythm.     Pulses: Normal pulses.     Heart sounds: Normal heart sounds. No murmur heard. Pulmonary:     Effort: Pulmonary effort is normal. No respiratory distress.     Breath sounds: Normal breath sounds. No wheezing.  Chest:     Chest wall: No mass.  Breasts:    Tanner Score is 5.     Right: Normal. No mass or tenderness.     Left: Normal. No mass or tenderness.  Abdominal:     General: Abdomen is flat. Bowel sounds are normal. There is no distension.     Palpations: Abdomen is soft. There is no mass.     Tenderness: There is no abdominal tenderness.  Genitourinary:    Comments: Deferred - referral placed to GYN. She has had a colonoscopy in the last 5 years she believes.  Musculoskeletal:        General: No swelling or tenderness. Normal range of motion.     Cervical back: Full passive range of motion without pain, normal range of motion and neck supple.     Right lower leg: No edema.     Left lower leg:  No edema.  Lymphadenopathy:     Upper Body:     Right upper body: No supraclavicular, axillary or pectoral adenopathy.     Left upper body: No supraclavicular, axillary or pectoral adenopathy.  Skin:    General: Skin is warm and dry.     Capillary Refill: Capillary refill takes less than 2 seconds.     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  Mental Status: She is alert and oriented to person, place, and time.     Cranial Nerves: No cranial nerve deficit.     Sensory: No sensory deficit.     Motor: No weakness.  Psychiatric:        Mood and Affect: Mood normal.        Behavior: Behavior normal.        Thought Content: Thought content normal.        Judgment: Judgment normal.        Assessment And Plan:     1. Encounter for annual physical exam Behavior modifications discussed and diet history reviewed.   Pt will continue to exercise regularly and modify diet with low GI, plant based foods and decrease intake of processed foods.  Recommend intake of daily multivitamin, Vitamin D, and calcium.  Recommend mammogram and colonoscopy (201 for preventive screenings, as well as recommend immunizations that include influenza, TDAP, and Shingles  2. Encounter for gynecological examination - Ambulatory referral to Obstetrics / Gynecology  3. Encounter for screening for HIV  4. Class 2 obesity due to excess calories without serious comorbidity with body mass index (BMI) of 39.0 to 39.9 in adult Comments: Encouraged to get in some physical activity when possible. She is encouraged to strive for BMI less than 30 to decrease cardiac risk. Advised to aim for at least 150 minutes of exercise per week. - Hemoglobin A1c - CMP14+EGFR - Lipid panel  5. Hypothyroidism, unspecified type Comments: She is taking levothyroxine, stable. Continue current medications pending results - TSH - T4 - T3, free  6. Multiple  sclerosis ([place]) Comments: Established with Dr Samantha [name].   7. Other long term (current) drug therapy - CBC      Patient was given opportunity to ask questions. Patient verbalized understanding of the plan and was able to repeat key elements of the plan. All questions were answered to their satisfaction.   Samantha Brine, FNP   I, Samantha Brine, FNP, have reviewed all documentation for this visit. The documentation on 06/11/21 for the exam, diagnosis, procedures, and orders are all accurate and complete.  THE PATIENT IS ENCOURAGED TO PRACTICE SOCIAL DISTANCING DUE TO THE COVID-19 PANDEMIC.

## 2021-06-05 NOTE — Patient Instructions (Signed)

## 2021-06-06 LAB — CMP14+EGFR
ALT: 14 IU/L (ref 0–32)
AST: 20 IU/L (ref 0–40)
Albumin/Globulin Ratio: 1.9 (ref 1.2–2.2)
Albumin: 4.5 g/dL (ref 3.8–4.8)
Alkaline Phosphatase: 116 IU/L (ref 44–121)
BUN/Creatinine Ratio: 12 (ref 9–23)
BUN: 9 mg/dL (ref 6–24)
Bilirubin Total: 0.2 mg/dL (ref 0.0–1.2)
CO2: 25 mmol/L (ref 20–29)
Calcium: 10.1 mg/dL (ref 8.7–10.2)
Chloride: 100 mmol/L (ref 96–106)
Creatinine, Ser: 0.75 mg/dL (ref 0.57–1.00)
Globulin, Total: 2.4 g/dL (ref 1.5–4.5)
Glucose: 86 mg/dL (ref 70–99)
Potassium: 4.5 mmol/L (ref 3.5–5.2)
Sodium: 141 mmol/L (ref 134–144)
Total Protein: 6.9 g/dL (ref 6.0–8.5)
eGFR: 98 mL/min/{1.73_m2} (ref >59)

## 2021-06-06 LAB — LIPID PANEL
Chol/HDL Ratio: 2.7 ratio (ref 0.0–4.4)
Cholesterol, Total: 196 mg/dL (ref 100–199)
HDL: 72 mg/dL (ref >39)
LDL Chol Calc (NIH): 111 mg/dL — ABNORMAL HIGH (ref 0–99)
Triglycerides: 71 mg/dL (ref 0–149)
VLDL Cholesterol Cal: 13 mg/dL (ref 5–40)

## 2021-06-06 LAB — T3, FREE: T3, Free: 2.7 pg/mL (ref 2.0–4.4)

## 2021-06-06 LAB — CBC
Hematocrit: 43.7 % (ref 34.0–46.6)
Hemoglobin: 14.8 g/dL (ref 11.1–15.9)
MCH: 30.1 pg (ref 26.6–33.0)
MCHC: 33.9 g/dL (ref 31.5–35.7)
MCV: 89 fL (ref 79–97)
Platelets: 332 10*3/uL (ref 150–450)
RBC: 4.91 x10E6/uL (ref 3.77–5.28)
RDW: 12.3 % (ref 11.7–15.4)
WBC: 10 10*3/uL (ref 3.4–10.8)

## 2021-06-06 LAB — HEMOGLOBIN A1C
Est. average glucose Bld gHb Est-mCnc: 114 mg/dL
Hgb A1c MFr Bld: 5.6 % (ref 4.8–5.6)

## 2021-06-06 LAB — TSH: TSH: 2.59 u[IU]/mL (ref 0.450–4.500)

## 2021-06-06 LAB — T4: T4, Total: 9.6 ug/dL (ref 4.5–12.0)

## 2021-06-19 ENCOUNTER — Telehealth: Payer: Self-pay

## 2021-06-19 NOTE — Telephone Encounter (Signed)
[facility] INT [facility] referring for Encounter for gynecological examination. Sch w [facility], CNM, OR KN. Called and left voicemail for patient to call back to be scheduled.

## 2021-06-23 NOTE — Telephone Encounter (Signed)
Patient is scheduled for 08/05/21 at 8:15 w [facility]

## 2021-06-25 ENCOUNTER — Other Ambulatory Visit: Payer: Self-pay | Admitting: Neurology

## 2021-07-24 ENCOUNTER — Other Ambulatory Visit: Payer: Self-pay | Admitting: Neurology

## 2021-08-05 ENCOUNTER — Encounter: Payer: Self-pay | Admitting: Obstetrics and Gynecology

## 2021-08-05 ENCOUNTER — Other Ambulatory Visit: Payer: Self-pay

## 2021-08-05 ENCOUNTER — Ambulatory Visit: Payer: 59 | Admitting: Obstetrics and Gynecology

## 2021-08-05 ENCOUNTER — Other Ambulatory Visit (HOSPITAL_COMMUNITY)
Admission: RE | Admit: 2021-08-05 | Discharge: 2021-08-05 | Disposition: A | Discharge status: Home / Self Care | Payer: 59 | Source: Ambulatory Visit | Attending: Obstetrics and Gynecology | Admitting: Obstetrics and Gynecology

## 2021-08-05 VITALS — BP 130/80 | Ht 65.0 in | Wt 254.0 lb

## 2021-08-05 DIAGNOSIS — Z1211 Encounter for screening for malignant neoplasm of colon: Secondary | ICD-10-CM

## 2021-08-05 DIAGNOSIS — Z1151 Encounter for screening for human papillomavirus (HPV): Secondary | ICD-10-CM | POA: Diagnosis not present

## 2021-08-05 DIAGNOSIS — Z124 Encounter for screening for malignant neoplasm of cervix: Secondary | ICD-10-CM | POA: Diagnosis not present

## 2021-08-05 DIAGNOSIS — Z30431 Encounter for routine checking of intrauterine contraceptive device: Secondary | ICD-10-CM

## 2021-08-05 DIAGNOSIS — Z1589 Genetic susceptibility to other disease: Secondary | ICD-10-CM

## 2021-08-05 DIAGNOSIS — Z01419 Encounter for gynecological examination (general) (routine) without abnormal findings: Secondary | ICD-10-CM

## 2021-08-05 DIAGNOSIS — Z1231 Encounter for screening mammogram for malignant neoplasm of breast: Secondary | ICD-10-CM

## 2021-08-05 MED ORDER — PEG 3350-KCL-NA BICARB-NACL 420 G PO SOLR
4000.0000 mL | Freq: Once | ORAL | 0 refills | Status: AC
Start: 2021-08-05 — End: 2021-08-05

## 2021-08-05 NOTE — Patient Instructions (Signed)
I value your feedback and you entrusting us with your care. If you get a [facility] patient survey, I would appreciate you taking the time to let us know about your experience today. Thank you! ? ? ?

## 2021-08-05 NOTE — Progress Notes (Signed)
Gastroenterology Pre-Procedure Review ? ?Request Date: 04/04/2022 ?Requesting Physician: Dr. Allegra Lai ? ? ?PATIENT REVIEW QUESTIONS: The patient responded to the following health history questions as indicated:   ? ?1. Are you having any GI issues? no ?2. Do you have a personal history of Polyps? no ?3. Do you have a family history of Colon Cancer or Polyps? yes (colon cancer) ?4. Diabetes Mellitus? no ?5. Joint replacements in the past 12 months?no ?6. Major health problems in the past 3 months?no ?7. Any artificial heart valves, MVP, or defibrillator?no ?   ?MEDICATIONS & ALLERGIES:    ?Patient reports the following regarding taking any anticoagulation/antiplatelet therapy:   ?Plavix, Coumadin, Eliquis, Xarelto, Lovenox, Pradaxa, Brilinta, or Effient? no ?Aspirin? no ? ?Patient confirms/reports the following medications:  ?Current Outpatient Medications  ?Medication Sig Dispense Refill  ? b complex vitamins capsule Take 1 capsule by mouth daily.    ? baclofen (LIORESAL) 10 MG tablet Take 10 mg by mouth 3 (three) times daily.    ? baclofen (LIORESAL) 10 MG tablet Take 1 tablet by mouth 3 (three) times daily.    ? calcium carbonate (OSCAL) 1500 (600 Ca) MG TABS tablet Take by mouth 2 (two) times daily with a meal.    ? Cholecalciferol 125 MCG (5000 UT) TABS Take by mouth.    ? Cranberry 300 MG tablet Take by mouth.    ? Cranberry 500 MG TABS Take by mouth.    ? ibuprofen (ADVIL) 400 MG tablet Take by mouth.    ? levothyroxine (SYNTHROID) 175 MCG tablet Take 1 tablet (175 mcg total) by mouth daily before breakfast. 90 tablet 1  ? Multiple Vitamin (MULTIVITAMIN) capsule Take by mouth.    ? nortriptyline (PAMELOR) 25 MG capsule Take 1 capsule (25 mg total) by mouth at bedtime. 90 capsule 0  ? Ocrelizumab (OCREVUS IV) Inject into the vein. Every 6 mths    ? oxybutynin (DITROPAN-XL) 10 MG 24 hr tablet Take 1 tablet (10 mg total) by mouth at bedtime. 90 tablet 3  ? pregabalin (LYRICA) 25 MG capsule Take 25 mg by mouth 2  (two) times daily.    ? ?No current facility-administered medications for this visit.  ? ? ?Patient confirms/reports the following allergies:  ?No Known Allergies ? ?No orders of the defined types were placed in this encounter. ? ? ?AUTHORIZATION INFORMATION ?Primary Insurance: ?1D#: ?Group #: ? ?Secondary Insurance: ?1D#: ?Group #: ? ?SCHEDULE INFORMATION: ?Date: 04/04/2022 ?Time: ?Location:[facility] ? ?

## 2021-08-05 NOTE — Progress Notes (Addendum)
PCP: [name], FNP   Chief Complaint  Patient presents with   Gynecologic Exam    No concerns    HPI:      [name] is a 51 y.o. No obstetric history on file. whose LMP was No LMP recorded. (Menstrual status: IUD)., presents today for her NP annual examination, referred by PCP.  Her menses are absent due to Mirena IUD, placed about 3 yrs ago. She does not have intermenstrual bleeding. She does not have vasomotor sx.   Sex activity: single partner, contraception - IUD. She does not have vaginal dryness.  Last Pap: [facility] yrs ago at [facility], neg results. No hx of abn paps.  Last mammogram: 04/11/20  Results were: normal--routine follow-up in 12 months; has order placed from PCP but hasn't scheduled. Had neg breast MRI 3/19 and 11/20; declines repeat.  There is a FH of breast cancer and uterine cancer in her mom. Pt is PTEN positive, was followed at [facility] but insurance doesn't cover them now. The patient does do self-breast exams.  Colonoscopy: 12/18 at [facility];  Repeat due after 5 years for PTEN genetic mutation.   Tobacco use: The patient denies current or previous tobacco use. Alcohol use: none Exercise: moderately active  She does get adequate calcium and Vitamin D in her diet.  Labs with PCP.  PTEN MUTATION: Pt was being followed at [facility] but insurance is OON now with them, so had to transfer care to [facility] facilities  Breast: yearly mammos, CBE and scr breast MRI yearly vs mastectomy; pt to schedule mammo, wants Q12 months CBE not Q6 months, declines breast MRI  Colorectal: colonscopy Q5 yrs; refer to GI for colonoscopy due 12/23  Derm/melanoma:  yearly skin check; pt can do appt on her own  Endometrial: EMB Q1-2 yrs , then u/s after menopause, consider hyst; pt spoke with genetic counselor and was told she didn't need EMB due to [place] IUD. I will look into this.  09/01/21 ADDENDUM: Spoke to Samantha [name], GC at [facility]. After further research on her part, pt  should still get EMB Q1-2 yrs regardless of Mirena IUD.   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   Multiple sclerosis ([place]) 03/18/2021   High risk medication use 03/18/2021   Dysesthesia 03/18/2021   Urinary urgency 03/18/2021   Spastic gait 03/18/2021    Past Surgical History:  Procedure Laterality Date   SHOULDER ARTHROSCOPY Left    THYROIDECTOMY     TONSILLECTOMY      Family History  Problem Relation Age of Onset   Ovarian cancer Mother    Breast cancer Mother    Cervical cancer Mother     Social History   Socioeconomic History   Marital status: Married    Spouse name: Not on file   Number of children: Not on file   Years of education: Not on file   Highest education [name]: Not on file  Occupational History   Not on file  Tobacco Use   Smoking status: Never   Smokeless tobacco: Never  Vaping Use   Vaping Use: Never used  Substance and Sexual Activity   Alcohol use: Yes    Comment: social   Drug  use: No   Sexual activity: Yes    Birth control/protection: I.U.D.    Comment: Mirena  Other Topics Concern   Not on file  Social History Narrative   Not on file   Social Determinants of Health   Financial Resource Strain: Not on file  Food Insecurity: Not on file  Transportation Needs: Not on file  Physical Activity: Not on file  Stress: Not on file  Social Connections: Not on file  Intimate Partner Violence: Not on file     Current Outpatient Medications:    b complex vitamins capsule, Take 1 capsule by mouth daily., Disp: , Rfl:    baclofen (LIORESAL) 10 MG tablet, Take 10 mg by mouth 3 (three) times daily., Disp: , Rfl:    baclofen (LIORESAL) 10 MG tablet, Take 1 tablet by mouth 3 (three) times daily., Disp:  , Rfl:    calcium carbonate (OSCAL) 1500 (600 Ca) MG TABS tablet, Take by mouth 2 (two) times daily with a meal., Disp: , Rfl:    Cholecalciferol 125 MCG (5000 UT) TABS, Take by mouth., Disp: , Rfl:    Cranberry 300 MG tablet, Take by mouth., Disp: , Rfl:    Cranberry 500 MG TABS, Take by mouth., Disp: , Rfl:    ibuprofen (ADVIL) 400 MG tablet, Take by mouth., Disp: , Rfl:    levothyroxine (SYNTHROID) 175 MCG tablet, Take 1 tablet (175 mcg total) by mouth daily before breakfast., Disp: 90 tablet, Rfl: 1   Multiple Vitamin (MULTIVITAMIN) capsule, Take by mouth., Disp: , Rfl:    nortriptyline (PAMELOR) 25 MG capsule, Take 1 capsule (25 mg total) by mouth at bedtime., Disp: 90 capsule, Rfl: 0   Ocrelizumab (OCREVUS IV), Inject into the vein. Every 6 mths, Disp: , Rfl:    oxybutynin (DITROPAN-XL) 10 MG 24 hr tablet, Take 1 tablet (10 mg total) by mouth at bedtime., Disp: 90 tablet, Rfl: 3   pregabalin (LYRICA) 25 MG capsule, Take 25 mg by mouth 2 (two) times daily., Disp: , Rfl:      ROS:  Review of Systems  Constitutional:  Negative for fatigue, fever and unexpected weight change.  Respiratory:  Negative for cough, shortness of breath and wheezing.   Cardiovascular:  Negative for chest pain, palpitations and leg swelling.  Gastrointestinal:  Negative for blood in stool, constipation, diarrhea, nausea and vomiting.  Endocrine: Negative for cold intolerance, heat intolerance and polyuria.  Genitourinary:  Negative for dyspareunia, dysuria, flank pain, frequency, genital sores, hematuria, menstrual problem, pelvic pain, urgency, vaginal bleeding, vaginal discharge and vaginal pain.  Musculoskeletal:  Negative for back pain, joint swelling and myalgias.  Skin:  Negative for rash.  Neurological:  Negative for dizziness, syncope, light-headedness, numbness and headaches.  Hematological:  Negative for adenopathy.  Psychiatric/Behavioral:  Negative for agitation, confusion, sleep disturbance and  suicidal ideas. The patient is not nervous/anxious.   BREAST: No symptoms    Objective: BP 130/80   Ht _0  (1.651 m)   Wt 254 lb (115.2 kg)   BMI 42.27 kg/m    Physical Exam Constitutional:      Appearance: She is well-developed.  Genitourinary:     Vulva normal.     Right Labia: No rash, tenderness or lesions.    Left Labia: No tenderness, lesions or rash.    No vaginal discharge, erythema or tenderness.      Right Adnexa: not tender and no mass present.    Left Adnexa: not tender and no mass present.  No cervical friability or polyp.     IUD strings visualized.     Uterus is not enlarged or tender.  Breasts:    Right: No mass, nipple discharge, skin change or tenderness.     Left: No mass, nipple discharge, skin change or tenderness.  Neck:     Thyroid: No thyromegaly.  Cardiovascular:     Rate and Rhythm: Normal rate and regular rhythm.     Heart sounds: Normal heart sounds. No murmur heard. Pulmonary:     Effort: Pulmonary effort is normal.     Breath sounds: Normal breath sounds.  Abdominal:     Palpations: Abdomen is soft.     Tenderness: There is no abdominal tenderness. There is no guarding or rebound.  Musculoskeletal:        General: Normal range of motion.     Cervical back: Normal range of motion.  Lymphadenopathy:     Cervical: No cervical adenopathy.  Neurological:     General: No focal deficit present.     Mental Status: She is alert and oriented to person, place, and time.     Cranial Nerves: No cranial nerve deficit.  Skin:    General: Skin is warm and dry.  Psychiatric:        Mood and Affect: Mood normal.        Behavior: Behavior normal.        Thought Content: Thought content normal.        Judgment: Judgment normal.  Vitals reviewed.    Assessment/Plan:  Encounter for annual routine gynecological examination  Cervical cancer screening - Plan: Cytology - PAP  Screening for HPV (human papillomavirus) - Plan: Cytology -  PAP  Encounter for routine checking of intrauterine contraceptive device (IUD)--IUD strings in cx os; has 8 yr indication.   Encounter for screening mammogram for malignant neoplasm of breast; pt has mammo order. Instructed to call for scr breast MRI after mammo if she wants imaging. Pt declines scr MRI for now. Cont SBE, yearly CBE.   PTEN gene mutation - Plan: Ambulatory referral to Gastroenterology; see notes in HPI. Refer to GI for scr colonoscopy due 12/23. Checking on need for endometrial bx with GC at [facility]   Screening for colon cancer - Plan: Ambulatory referral to Gastroenterology          GYN counsel breast self exam, mammography screening, menopause, adequate intake of calcium and vitamin D, diet and exercise    F/U  Return in about 1 year (around 08/06/2022).  Decklyn Hornik B. Niyati Heinke, PA-C 08/06/2021 10:51 AM  09/01/21 LM for pt that she is still do for Q1-2 yr EMB per GC. Pt to call to schedule if desires.

## 2021-08-06 ENCOUNTER — Encounter: Payer: Self-pay | Admitting: Obstetrics and Gynecology

## 2021-08-08 NOTE — Progress Notes (Signed)
Pls have lab add trich testing to this pap. Thx

## 2021-08-11 LAB — CYTOLOGY - PAP
Comment: NEGATIVE
Comment: NEGATIVE
Diagnosis: NEGATIVE
Diagnosis: REACTIVE
High risk HPV: NEGATIVE
Trichomonas: POSITIVE — AB

## 2021-08-12 ENCOUNTER — Other Ambulatory Visit: Payer: Self-pay | Admitting: Obstetrics and Gynecology

## 2021-08-12 DIAGNOSIS — A599 Trichomoniasis, unspecified: Secondary | ICD-10-CM

## 2021-08-12 MED ORDER — METRONIDAZOLE 500 MG PO TABS: ORAL_TABLET | ORAL | 0 refills | Status: DC

## 2021-08-12 NOTE — Progress Notes (Signed)
Rx flagyl for trich on pap.  ?

## 2021-09-24 ENCOUNTER — Encounter: Payer: Self-pay | Admitting: Neurology

## 2021-09-24 ENCOUNTER — Ambulatory Visit: Payer: 59 | Admitting: Neurology

## 2021-09-24 VITALS — BP 118/76 | HR 90 | Ht 65.0 in | Wt 260.0 lb

## 2021-09-24 DIAGNOSIS — G35 Multiple sclerosis: Secondary | ICD-10-CM | POA: Diagnosis not present

## 2021-09-24 DIAGNOSIS — R3915 Urgency of urination: Secondary | ICD-10-CM | POA: Diagnosis not present

## 2021-09-24 DIAGNOSIS — R261 Paralytic gait: Secondary | ICD-10-CM | POA: Diagnosis not present

## 2021-09-24 DIAGNOSIS — R208 Other disturbances of skin sensation: Secondary | ICD-10-CM

## 2021-09-24 DIAGNOSIS — Z79899 Other long term (current) drug therapy: Secondary | ICD-10-CM

## 2021-09-24 MED ORDER — PREGABALIN 75 MG PO CAPS: 75.0000 mg | ORAL_CAPSULE | Freq: Two times a day (BID) | ORAL | 5 refills | Status: DC

## 2021-09-24 MED ORDER — ETODOLAC 400 MG PO TABS: 400.0000 mg | ORAL_TABLET | Freq: Two times a day (BID) | ORAL | 5 refills | Status: DC

## 2021-09-24 NOTE — Progress Notes (Signed)
[facility]  PATIENT: Samantha [name] DOB: 06-22-1970  REFERRING DOCTOR OR PCP: Priscille Kluver, PA-C SOURCE: Patient, notes from [facility] (Dr. Elwyn Reach), imaging and laboratory reports, MRI images ere personally reviewed  _________________________________   HISTORICAL  CHIEF COMPLAINT:  Chief Complaint  Patient presents with   Follow-up    Rm 2, alone. Pt here for 6 month MS f/u, on Ocrevus and tolerating well. Last infusion date: 05/12/2021 and Next infusion date: 11/10/2021. Pt reports trouble w legs, having weakness and numbness some days.     HISTORY OF PRESENT ILLNESS:  Samantha [name] is a 52 y.o. woman with relapsing remitting multiple sclerosis  Update 09/24/2021: She was diagnosed with MS in 2012 after presenting with numbness.  She is currently on Ocrevus.  The last infusion was late January 2023  Currently she is having dysesthesias and deeper leg pain.    Sometimes pain is  like being stabbed.   Gabapentin had not helped    Nortriptyline has not helped much.   Lyrica only helped a little bit.   She is on baclofen 20 mg tid.    It helps muscle spasticity some.    Gait is doing well and she has mildly reduced balance.   She holds on to  the bannister going downstairs.    She has urinary urgency helped by oxybutynin.       She has more stress (in [place] of divorcee).     No depression.   She works third shift and sleeps poorly     Cognition is usually ok but has some word finding difficulties.    MS history:  She was diagnosed with MS around 2012.   She woke up with numbness in her left face and tongue.    She had an MRI of the brain and was diagnosed with multiple sclerosis.   She was diagnosed in [place], [place].  She received IV steroids and numbness improved over time.    She was placed on Tecfidera x a couple years but had a relapse and then was placed on Plegridy.   She had difficulty with injections so switched to Ocrevus in 2019.   Her MS  has done well and she has not had any exacerbations.     She is currently getting infusions at [facility] in [facility], [place].     She has been seeing Dr. Elwyn Reach the past 3 years at [facility] but insurance company is making her switch.   IMAGING: Personally reviewed today MRI of the brain 08/12/2020 shows multiple T2/Flair hyperintense foci in the periventrixular, juxtacotical and deep white matter and 2 foci in cerebellar hemisphere.   No enhancement    No change (by report) compared to 2020.  MRI of the cervical spine 03/17/2017 shows a T2 hyperintense focus at C2.   It was otherwise normal.  No enhancing lesions.  LABS: IgG was normal.  IgM borderline (57 at [facility]), Vit D was normal.  QuantiFERON TB and hepatitis chronic infection labs were normal in 2017.  REVIEW OF SYSTEMS: Constitutional: No fevers, chills, sweats, or change in appetite Eyes: No visual changes, double vision, eye pain Ear, nose and throat: No hearing loss, ear pain, nasal congestion, sore throat Cardiovascular: No chest pain, palpitations Respiratory:  No shortness of breath at rest or with exertion.   No wheezes GastrointestinaI: No nausea, vomiting, diarrhea, abdominal pain, fecal incontinence Genitourinary:  No dysuria, urinary retention or frequency.  No nocturia. Musculoskeletal:  No  neck pain, back pain Integumentary: No rash, pruritus, skin lesions Neurological: as above Psychiatric: No depression at this time.  No anxiety Endocrine: No palpitations, diaphoresis, change in appetite, change in weigh or increased thirst Hematologic/Lymphatic:  No anemia, purpura, petechiae. Allergic/Immunologic: No itchy/runny eyes, nasal congestion, recent allergic reactions, rashes  ALLERGIES: No Known Allergies  HOME MEDICATIONS:  Current Outpatient Medications:    etodolac (LODINE) 400 MG tablet, Take 1 tablet (400 mg total) by mouth 2 (two) times daily., Disp: 60 tablet, Rfl: 5   b complex vitamins capsule, Take 1  capsule by mouth daily., Disp: , Rfl:    baclofen (LIORESAL) 10 MG tablet, Take 10 mg by mouth 3 (three) times daily., Disp: , Rfl:    baclofen (LIORESAL) 10 MG tablet, Take 1 tablet by mouth 3 (three) times daily., Disp: , Rfl:    calcium carbonate (OSCAL) 1500 (600 Ca) MG TABS tablet, Take by mouth 2 (two) times daily with a meal., Disp: , Rfl:    Cholecalciferol 125 MCG (5000 UT) TABS, Take by mouth., Disp: , Rfl:    Cranberry 300 MG tablet, Take by mouth., Disp: , Rfl:    Cranberry 500 MG TABS, Take by mouth., Disp: , Rfl:    ibuprofen (ADVIL) 400 MG tablet, Take by mouth., Disp: , Rfl:    levothyroxine (SYNTHROID) 175 MCG tablet, Take 1 tablet (175 mcg total) by mouth daily before breakfast., Disp: 90 tablet, Rfl: 1   metroNIDAZOLE (FLAGYL) 500 MG tablet, Take 2 tabs BID for 1 day, Disp: 4 tablet, Rfl: 0   Multiple Vitamin (MULTIVITAMIN) capsule, Take by mouth., Disp: , Rfl:    nortriptyline (PAMELOR) 25 MG capsule, Take 1 capsule (25 mg total) by mouth at bedtime., Disp: 90 capsule, Rfl: 0   Ocrelizumab (OCREVUS IV), Inject into the vein. Every 6 mths, Disp: , Rfl:    oxybutynin (DITROPAN-XL) 10 MG 24 hr tablet, Take 1 tablet (10 mg total) by mouth at bedtime., Disp: 90 tablet, Rfl: 3   pregabalin (LYRICA) 75 MG capsule, Take 1 capsule (75 mg total) by mouth 2 (two) times daily., Disp: 60 capsule, Rfl: 5  PAST MEDICAL HISTORY: Past Medical History:  Diagnosis Date   H/O shoulder surgery    Hypothyroid    Multiple sclerosis (HCC)    Obese     PAST SURGICAL HISTORY: Past Surgical History:  Procedure Laterality Date   SHOULDER ARTHROSCOPY Left    THYROIDECTOMY     TONSILLECTOMY      FAMILY HISTORY: Family History  Problem Relation Age of Onset   Ovarian cancer Mother    Breast cancer Mother    Cervical cancer Mother     SOCIAL HISTORY:  Social History   Socioeconomic History   Marital status: Married    Spouse name: Not on file   Number of children: Not on file    Years of education: Not on file   Highest education level: Not on file  Occupational History   Not on file  Tobacco Use   Smoking status: Never   Smokeless tobacco: Never  Vaping Use   Vaping Use: Never used  Substance and Sexual Activity   Alcohol use: Yes    Comment: social   Drug use: No   Sexual activity: Yes    Birth control/protection: I.U.D.    Comment: Mirena  Other Topics Concern   Not on file  Social History Narrative   Not on file   Social Determinants of Corporate investment banker  Strain: Not on file  Food Insecurity: Not on file  Transportation Needs: Not on file  Physical Activity: Not on file  Stress: Not on file  Social Connections: Not on file  Intimate Partner Violence: Not on file     PHYSICAL EXAM  Vitals:   09/24/21 1317  BP: 118/76  Pulse: 90  Weight: 260 lb (117.9 kg)  Height: 5\' 5"  (1.651 m)    Body mass index is 43.27 kg/m.   General: The patient is well-developed and well-nourished and in no acute distress  HEENT:  Head is [place]/AT.  Sclera are anicteric.     Neck: Good range of motion.  The neck is nontender.   Skin: Extremities are without rash or  edema.  Musculoskeletal:  Back is nontender  Neurologic Exam  Mental status: The patient is alert and oriented x 3 at the time of the examination. The patient has apparent normal recent and remote memory, with an apparently normal attention span and concentration ability.   Speech is normal.  Cranial nerves: Extraocular movements are full.  Facial strength and sensation was normal.  No dysarthria.. No obvious hearing deficits are noted.  Motor:  Muscle bulk is normal.   Tone is slightly increased in legs.. Strength is  5 / 5 in all 4 extremities.   Sensory: Sensory testing is intact to pinprick, soft touch and vibration sensation in all 4 extremities except slight reduced sensation in the lateral femoral cutaneous.  Coordination: Cerebellar testing reveals good finger-nose-finger  and heel-to-shin bilaterally.  Gait and station: Station is normal.   Gait is spastic and mildly wide. Tandem gait is wide.  . Romberg is negative.   Reflexes: Deep tendon reflexes are symmetric and normal bilaterally.       Lab Results  Component Value Date   TSH 2.590 06/05/2021       ASSESSMENT AND PLAN  Multiple sclerosis (HCC) - Plan: IgG, IgA, IgM, CD20 B Cells  High risk medication use - Plan: IgG, IgA, IgM, CD20 B Cells  Urinary urgency  Spastic gait  Dysesthesia  1.  She will continue Ocrevus.  We will check some lab work today.  Her next infusion will be at the end of July 2.   She has superficial and deep pain.  The deep pain might be muscle spasm related and she will continue baclofen and I will have her take a twice a day anti-inflammatory as it may help her better than the ibuprofen.  For the more neuropathic type pain, I will also increase the Lyrica she is on a very low-dose of 25 mg twice a day.  We will increase this to 75 mg twice a day.  This can be increased further depending on the response. 3.  Stay active exercise as tolerated. 4.    Return in 6 months or sooner if there are new or worsening neurologic symptoms.  Suraj Ramdass A. August, MD, [facility] 09/24/2021, 2:17 PM Certified in Neurology, Clinical Neurophysiology, Sleep Medicine and Neuroimaging  [facility] [address] [place], [place] [phone]

## 2021-09-25 LAB — IGG, IGA, IGM
IgA/Immunoglobulin A, Serum: 162 mg/dL (ref 87–352)
IgG (Immunoglobin G), Serum: 873 mg/dL (ref 586–1602)
IgM (Immunoglobulin M), Srm: 41 mg/dL (ref 26–217)

## 2021-09-25 LAB — CD20 B CELLS
% CD19-B Cells: 0.2 % — ABNORMAL LOW (ref 4.6–22.1)
% CD20-B Cells: 0 % — ABNORMAL LOW (ref 5.0–22.3)

## 2021-10-23 ENCOUNTER — Other Ambulatory Visit: Payer: Self-pay | Admitting: Neurology

## 2021-11-17 ENCOUNTER — Other Ambulatory Visit: Payer: Self-pay | Admitting: Neurology

## 2021-11-18 ENCOUNTER — Other Ambulatory Visit: Payer: Self-pay | Admitting: Nurse Practitioner

## 2021-11-18 DIAGNOSIS — E039 Hypothyroidism, unspecified: Secondary | ICD-10-CM

## 2021-12-04 ENCOUNTER — Ambulatory Visit: Payer: 59 | Admitting: Nurse Practitioner

## 2021-12-11 ENCOUNTER — Encounter: Payer: Self-pay | Admitting: Nurse Practitioner

## 2021-12-11 ENCOUNTER — Ambulatory Visit: Payer: 59 | Admitting: Nurse Practitioner

## 2021-12-11 VITALS — BP 136/76 | HR 81 | Temp 98.2°F | Ht 65.0 in | Wt 263.0 lb

## 2021-12-11 DIAGNOSIS — G35 Multiple sclerosis: Secondary | ICD-10-CM | POA: Diagnosis not present

## 2021-12-11 DIAGNOSIS — Z23 Encounter for immunization: Secondary | ICD-10-CM | POA: Diagnosis not present

## 2021-12-11 DIAGNOSIS — E039 Hypothyroidism, unspecified: Secondary | ICD-10-CM | POA: Diagnosis not present

## 2021-12-11 NOTE — Progress Notes (Signed)
Hershal Coria Martin,acting as a Neurosurgeon for [name], FNP.,have documented all relevant documentation on the behalf of [name], FNP,as directed by  [name], FNP while in the presence of [name], FNP.    Subjective:     Patient ID: Samantha [name] , female    DOB: 01-Oct-1970 , 51 y.o.   MRN: [id number]   Chief Complaint  Patient presents with   Hypothyroidism    HPI  Patient presents today for a thyroid follow up, patient states compliance with all medications and has no other issues today. Continues to be followed by Neurology - she was added on Etodolac due to joint pain.   BP Readings from Last 3 Encounters: 12/11/21 : 136/76 09/24/21 : 118/76 08/05/21 : 130/80  Wt Readings from Last 3 Encounters: 12/11/21 : 263 lb (119.3 kg) 09/24/21 : 260 lb (117.9 kg) 08/05/21 : 254 lb (115.2 kg)         Past Medical History:  Diagnosis Date   H/O shoulder surgery    Hypothyroid    Multiple sclerosis (HCC)    Obese      Family History  Problem Relation Age of Onset   Ovarian cancer Mother    Breast cancer Mother    Cervical cancer Mother      Current Outpatient Medications:    b complex vitamins capsule, Take 1 capsule by mouth daily., Disp: , Rfl:    calcium carbonate (OSCAL) 1500 (600 Ca) MG TABS tablet, Take by mouth 2 (two) times daily with a meal., Disp: , Rfl:    Cholecalciferol 125 MCG (5000 UT) TABS, Take by mouth., Disp: , Rfl:    Cranberry 300 MG tablet, Take by mouth., Disp: , Rfl:    Cranberry 500 MG TABS, Take by mouth., Disp: , Rfl:    etodolac (LODINE) 400 MG tablet, Take 1 tablet (400 mg total) by mouth 2 (two) times daily., Disp: 60 tablet, Rfl: 5   ibuprofen (ADVIL) 400 MG tablet, Take by mouth., Disp: , Rfl:    levothyroxine (SYNTHROID) 175 MCG tablet, TAKE 1 TABLET BY MOUTH DAILY BEFORE BREAKFAST., Disp: 30 tablet, Rfl: 5   metroNIDAZOLE (FLAGYL) 500 MG tablet, Take 2 tabs BID for 1 day, Disp: 4 tablet, Rfl: 0   Multiple Vitamin  (MULTIVITAMIN) capsule, Take by mouth., Disp: , Rfl:    nortriptyline (PAMELOR) 25 MG capsule, TAKE 1 CAPSULE BY MOUTH EVERYDAY AT BEDTIME, Disp: 90 capsule, Rfl: 2   Ocrelizumab (OCREVUS IV), Inject into the vein. Every 6 mths, Disp: , Rfl:    oxybutynin (DITROPAN-XL) 10 MG 24 hr tablet, Take 1 tablet (10 mg total) by mouth at bedtime., Disp: 90 tablet, Rfl: 3   pregabalin (LYRICA) 75 MG capsule, Take 1 capsule (75 mg total) by mouth 2 (two) times daily., Disp: 60 capsule, Rfl: 5   baclofen (LIORESAL) 10 MG tablet, Take 1 tablet (10 mg total) by mouth 3 (three) times daily., Disp: 30 each, Rfl: 2   No Known Allergies   Review of Systems  Constitutional: Negative.   HENT: Negative.    Eyes: Negative.   Respiratory: Negative.    Cardiovascular: Negative.   Gastrointestinal: Negative.   Endocrine: Negative.   Genitourinary: Negative.   Musculoskeletal: Negative.   Skin: Negative.   Allergic/Immunologic: Negative.   Neurological: Negative.   Hematological: Negative.   Psychiatric/Behavioral: Negative.    All other systems reviewed and are negative.    Today's Vitals   12/11/21 1500  BP: 136/76  Pulse:  81  Temp: 98.2 F (36.8 C)  TempSrc: Oral  Weight: 263 lb (119.3 kg)  Height: 5\' 5"  (1.651 m)  PainSc: 0-No pain   Body mass index is 43.77 kg/m.  Wt Readings from Last 3 Encounters:  12/11/21 263 lb (119.3 kg)  09/24/21 260 lb (117.9 kg)  08/05/21 254 lb (115.2 kg)     Objective:  Physical Exam Vitals reviewed.  Constitutional:      General: She is not in acute distress.    Appearance: Normal appearance. She is well-developed. She is obese.  HENT:     Head: Normocephalic and atraumatic.  Eyes:     Pupils: Pupils are equal, round, and reactive to light.  Cardiovascular:     Rate and Rhythm: Normal rate and regular rhythm.     Pulses: Normal pulses.     Heart sounds: Normal heart sounds. No murmur heard. Pulmonary:     Effort: Pulmonary effort is normal. No  respiratory distress.     Breath sounds: Normal breath sounds. No wheezing.  Skin:    General: Skin is warm and dry.     Capillary Refill: Capillary refill takes less than 2 seconds.  Neurological:     General: No focal deficit present.     Mental Status: She is alert and oriented to person, place, and time.     Cranial Nerves: No cranial nerve deficit.     Motor: No weakness.  Psychiatric:        Mood and Affect: Mood normal.        Behavior: Behavior normal.        Thought Content: Thought content normal.        Judgment: Judgment normal.         Assessment And Plan:     1. Hypothyroidism, unspecified type Comments: Stable, continue current medications.  - TSH - T4, Free  2. Multiple sclerosis (HCC) Comments: She has been doing well. No recent episodes  3. Need for influenza vaccination Influenza vaccine administered Encouraged to take Tylenol as needed for fever or muscle aches. - Flu Vaccine QUAD 6+ mos PF IM (Fluarix Quad PF)     Patient was given opportunity to ask questions. Patient verbalized understanding of the plan and was able to repeat key elements of the plan. All questions were answered to their satisfaction.  08/07/21, FNP   I, [name], FNP, have reviewed all documentation for this visit. The documentation on 12/11/21 for the exam, diagnosis, procedures, and orders are all accurate and complete.   IF YOU HAVE BEEN REFERRED TO A SPECIALIST, IT MAY TAKE 1-2 WEEKS TO SCHEDULE/PROCESS THE REFERRAL. IF YOU HAVE NOT HEARD FROM US/SPECIALIST IN TWO WEEKS, PLEASE GIVE 12/13/21 A CALL AT [phone].   THE PATIENT IS ENCOURAGED TO PRACTICE SOCIAL DISTANCING DUE TO THE COVID-19 PANDEMIC.

## 2021-12-11 NOTE — Patient Instructions (Addendum)
Hyperthyroidism Hyperthyroidism refers to a thyroid gland that is too active or overactive. The thyroid gland is a small gland located in the lower front part of the neck, just in front of the windpipe (trachea). This gland makes hormones that: Help control how the body uses food for energy (metabolism). Help the heart and brain work well. Keep your bones strong. When the thyroid is overactive, it produces too much of a hormone called thyroxine. What are the causes? This condition may be caused by: Graves' disease. This is a disorder in which the body's disease-fighting system (immune system) attacks the thyroid gland. This is the most common cause. Inflammation of the thyroid gland. A tumor in the thyroid gland. Use of certain medicines, including: Prescription thyroid hormone replacement. Herbal supplements that mimic thyroid hormones. Amiodarone therapy. Solid or fluid-filled lumps within your thyroid gland (thyroid nodules). Taking in a large amount of iodine from foods or medicines. What increases the risk? You are more likely to develop this condition if: You are female. You have a family history of thyroid conditions. You smoke tobacco. You use a medicine called lithium. You take medicines that affect the immune system (immunosuppressants). What are the signs or symptoms? Symptoms of this condition include: Nervousness. Inability to tolerate heat. Diarrhea. Rapid heart rate. Shaky hands. Restlessness. Sleep problems. Other symptoms may include: Heart skipping beats or making extra beats. Unexplained weight loss. Change in the texture of hair or skin. Loss of menstruation. Fatigue. Enlarged thyroid gland or a lump in the thyroid (nodule). You may also have symptoms of Graves' disease, which may include: Protruding eyes. Dry eyes. Red or swollen eyes. Problems with vision. How is this diagnosed? This condition may be diagnosed based on: Your symptoms and medical  history. A physical exam. Blood tests. Thyroid ultrasound. This test involves using sound waves to produce images of the thyroid gland. A thyroid scan. A radioactive substance is injected into a vein, and images show how much iodine is present in the thyroid. Radioactive iodine uptake test (RAIU). A small amount of radioactive iodine is given by mouth to see how much iodine the thyroid absorbs after a certain amount of time. How is this treated? Treatment depends on the cause and severity of the condition. Treatment may include: Medicines to reduce the amount of thyroid hormone your body makes. Medicines to help manage your symptoms. Radioactive iodine treatment (radioiodine therapy). This involves swallowing a small dose of radioactive iodine, in capsule or liquid form, to kill thyroid cells. Surgery to remove part or all of your thyroid gland. You may need to take thyroid hormone replacement medicine for the rest of your life after thyroid surgery. Follow these instructions at home:  Take over-the-counter and prescription medicines only as told by your health care provider. Do not use any products that contain nicotine or tobacco. These products include cigarettes, chewing tobacco, and vaping devices, such as e-cigarettes. If you need help quitting, ask your health care provider. Follow any instructions from your health care provider about diet. You may be instructed to limit foods that contain iodine. Keep all follow-up visits. You will need to have blood tests regularly so that your health care provider can monitor your condition. Where to find more information National Institute of Diabetes and Digestive and Kidney Diseases: niddk.nih.gov Contact a health care provider if: Your symptoms do not get better with treatment. You have a fever. You have abdominal pain. You feel dizzy. You are taking thyroid hormone replacement medicine and: You have   symptoms of depression. You feel like you  are tired all the time. You gain weight. Get help right away if: You have sudden, unexplained confusion or other mental changes. You have chest pain. You have fast or irregular heartbeats (palpitations). You have difficulty breathing. These symptoms may be an emergency. Get help right away. Call 911. Do not wait to see if the symptoms will go away. Do not drive yourself to the hospital. Summary The thyroid gland is a small gland located in the lower front part of the neck, just in front of the windpipe. Hyperthyroidism is when the thyroid gland is too active and produces too much of a hormone called thyroxine. The most common cause is Graves' disease, a disorder in which your immune system attacks the thyroid gland. Hyperthyroidism can cause various symptoms, such as unexplained weight loss, nervousness, inability to tolerate heat, or changes in your heartbeat. Treatment may include medicine to reduce the amount of thyroid hormone your body makes, radioiodine therapy, surgery, or medicines to manage symptoms. This information is not intended to replace advice given to you by your health care provider. Make sure you discuss any questions you have with your health care provider. Document Revised: 05/23/2021 Document Reviewed: 05/23/2021 Elsevier Patient Education  2023 Else[employer] Inc.   Influenza (Flu) Vaccine (Inactivated or Recombinant): What You Need to Know 1. Why get vaccinated? Influenza vaccine can prevent influenza (flu). Flu is a contagious disease that spreads around the [place] every year, usually between October and May. Anyone can get the flu, but it is more dangerous for some people. Infants and young children, people 65 years and older, pregnant people, and people with certain health conditions or a weakened immune system are at greatest risk of flu complications. Pneumonia, bronchitis, sinus infections, and ear infections are examples of flu-related complications. If you  have a medical condition, such as heart disease, cancer, or diabetes, flu can make it worse. Flu can cause fever and chills, sore throat, muscle aches, fatigue, cough, headache, and runny or stuffy nose. Some people may have vomiting and diarrhea, though this is more common in children than adults. In an average year, thousands of people in the [place] die from flu, and many more are hospitalized. Flu vaccine prevents millions of illnesses and flu-related visits to the doctor each year. 2. Influenza vaccines CDC recommends everyone 6 months and older get vaccinated every flu season. Children 6 months through 8 years of age may need 2 doses during a single flu season. Everyone else needs only 1 dose each flu season. It takes about 2 weeks for protection to develop after vaccination. There are many flu viruses, and they are always changing. Each year a new flu vaccine is made to protect against the influenza viruses believed to be likely to cause disease in the upcoming flu season. Even when the vaccine doesn't exactly match these viruses, it may still provide some protection. Influenza vaccine does not cause flu. Influenza vaccine may be given at the same time as other vaccines. 3. Talk with your health care provider Tell your vaccination provider if the person getting the vaccine: Has had an allergic reaction after a previous dose of influenza vaccine, or has any severe, life-threatening allergies Has ever had Guillain-Barr Syndrome (also called "GBS") In some cases, your health care provider may decide to postpone influenza vaccination until a future visit. Influenza vaccine can be administered at any time during pregnancy. People who are or will be pregnant during influenza season   should receive inactivated influenza vaccine. People with minor illnesses, such as a cold, may be vaccinated. People who are moderately or severely ill should usually wait until they recover before getting influenza  vaccine. Your health care provider can give you more information. 4. Risks of a vaccine reaction Soreness, redness, and swelling where the shot is given, fever, muscle aches, and headache can happen after influenza vaccination. There may be a very small increased risk of Guillain-Barr Syndrome (GBS) after inactivated influenza vaccine (the flu shot). Young children who get the flu shot along with pneumococcal vaccine (PCV13) and/or DTaP vaccine at the same time might be slightly more likely to have a seizure caused by fever. Tell your health care provider if a child who is getting flu vaccine has ever had a seizure. People sometimes faint after medical procedures, including vaccination. Tell your provider if you feel dizzy or have vision changes or ringing in the ears. As with any medicine, there is a very remote chance of a vaccine causing a severe allergic reaction, other serious injury, or death. 5. What if there is a serious problem? An allergic reaction could occur after the vaccinated person leaves the clinic. If you see signs of a severe allergic reaction (hives, swelling of the face and throat, difficulty breathing, a fast heartbeat, dizziness, or weakness), call 9-1-1 and get the person to the nearest hospital. For other signs that concern you, call your health care provider. Adverse reactions should be reported to the Vaccine Adverse Event Reporting System (VAERS). Your health care provider will usually file this report, or you can do it yourself. Visit the VAERS website at www.vaers.hhs.gov or call 1-800-822-7967. VAERS is only for reporting reactions, and VAERS staff members do not give medical advice. 6. The National Vaccine Injury Compensation Program The National Vaccine Injury Compensation Program (VICP) is a federal program that was created to compensate people who may have been injured by certain vaccines. Claims regarding alleged injury or death due to vaccination have a time limit  for filing, which may be as short as two years. Visit the VICP website at www.hrsa.gov/vaccinecompensation or call 1-800-338-2382 to learn about the program and about filing a claim. 7. How can I learn more? Ask your health care provider. Call your local or state health department. Visit the website of the Food and Drug Administration (FDA) for vaccine package inserts and additional information at www.fda.gov/vaccines-blood-biologics/vaccines. Contact the Centers for Disease Control and Prevention (CDC): Call 1-800-232-4636 (1-800-CDC-INFO) or Visit CDC's website at www.cdc.gov/flu. Source: CDC Vaccine Information Statement Inactivated Influenza Vaccine (11/17/2019) This same material is available at www.cdc.gov for no charge. This information is not intended to replace advice given to you by your health care provider. Make sure you discuss any questions you have with your health care provider. Document Revised: 02/26/2021 Document Reviewed: 12/19/2020 Elsevier Patient Education  2023 Elsevier Inc.  

## 2021-12-12 LAB — T4, FREE: Free T4: 1.47 ng/dL (ref 0.82–1.77)

## 2021-12-12 LAB — TSH: TSH: 1.32 u[IU]/mL (ref 0.450–4.500)

## 2021-12-22 ENCOUNTER — Other Ambulatory Visit (HOSPITAL_COMMUNITY): Payer: Self-pay

## 2021-12-22 ENCOUNTER — Other Ambulatory Visit: Payer: Self-pay | Admitting: Nurse Practitioner

## 2021-12-22 MED ORDER — BACLOFEN 10 MG PO TABS
10.0000 mg | ORAL_TABLET | Freq: Three times a day (TID) | ORAL | 2 refills | Status: DC
  Filled 2021-12-22: qty 30, 10d supply, fill #0

## 2021-12-23 ENCOUNTER — Other Ambulatory Visit (HOSPITAL_COMMUNITY): Payer: Self-pay

## 2021-12-24 ENCOUNTER — Other Ambulatory Visit: Payer: Self-pay | Admitting: *Deleted

## 2021-12-24 ENCOUNTER — Encounter: Payer: Self-pay | Admitting: Neurology

## 2021-12-24 MED ORDER — BACLOFEN 10 MG PO TABS: 10.0000 mg | ORAL_TABLET | Freq: Three times a day (TID) | ORAL | 5 refills | Status: DC

## 2021-12-30 ENCOUNTER — Other Ambulatory Visit (HOSPITAL_COMMUNITY): Payer: Self-pay

## 2022-02-10 ENCOUNTER — Encounter: Payer: Self-pay | Admitting: Nurse Practitioner

## 2022-02-10 ENCOUNTER — Ambulatory Visit: Payer: 59 | Admitting: Nurse Practitioner

## 2022-02-10 VITALS — BP 110/78 | HR 91 | Temp 98.1°F | Ht 65.0 in | Wt 266.8 lb

## 2022-02-10 DIAGNOSIS — Z6841 Body Mass Index (BMI) 40.0 and over, adult: Secondary | ICD-10-CM | POA: Diagnosis not present

## 2022-02-10 DIAGNOSIS — M545 Low back pain, unspecified: Secondary | ICD-10-CM

## 2022-02-10 DIAGNOSIS — E661 Drug-induced obesity: Secondary | ICD-10-CM | POA: Diagnosis not present

## 2022-02-10 LAB — POCT URINALYSIS DIPSTICK
Blood, UA: NEGATIVE
Glucose, UA: NEGATIVE
Ketones, UA: NEGATIVE
Leukocytes, UA: NEGATIVE
Nitrite, UA: NEGATIVE
Protein, UA: NEGATIVE
Spec Grav, UA: 1.01 (ref 1.010–1.025)
Urobilinogen, UA: 0.2 E.U./dL
pH, UA: 6.5 (ref 5.0–8.0)

## 2022-02-10 MED ORDER — CYCLOBENZAPRINE HCL 10 MG PO TABS: 10.0000 mg | ORAL_TABLET | Freq: Three times a day (TID) | ORAL | 0 refills | Status: DC | PRN

## 2022-02-10 MED ORDER — NITROFURANTOIN MONOHYD MACRO 100 MG PO CAPS: 100.0000 mg | ORAL_CAPSULE | Freq: Two times a day (BID) | ORAL | 0 refills | Status: DC

## 2022-02-10 NOTE — Progress Notes (Signed)
I,Victoria T Hamilton,acting as a Neurosurgeon for Samantha Felts, FNP.,have documented all relevant documentation on the behalf of Samantha Felts, FNP,as directed by  Samantha Felts, FNP while in the presence of Samantha Felts, FNP.    Subjective:     Patient ID: Samantha [name] , female    DOB: February 27, 1971 , 51 y.o.   MRN: [id number]   Chief Complaint  Patient presents with   Back Pain    HPI  Pt presents today for lower back pain across the entire area. Patient reports when she experienced this along with muscle spasms in hands and feet she did have a UTI.   Patient est with neurologist.   Back Pain This is a new problem. The current episode started in the past 7 days (5 days ago). The problem is unchanged. The pain is present in the lumbar spine. The quality of the pain is described as shooting. The pain does not radiate. The symptoms are aggravated by position. Pertinent negatives include no abdominal pain, headaches, numbness, paresthesias or weakness. (Reports her muscle spasms are worse and more numb than usual) Risk factors include obesity, lack of exercise and sedentary lifestyle. She has tried heat and analgesics for the symptoms. The treatment provided no relief.     Past Medical History:  Diagnosis Date   H/O shoulder surgery    Hypothyroid    Multiple sclerosis (HCC)    Obese      Family History  Problem Relation Age of Onset   Ovarian cancer Mother    Breast cancer Mother    Cervical cancer Mother      Current Outpatient Medications:    b complex vitamins capsule, Take 1 capsule by mouth daily., Disp: , Rfl:    baclofen (LIORESAL) 10 MG tablet, Take 1 tablet (10 mg total) by mouth 3 (three) times daily., Disp: 90 each, Rfl: 5   calcium carbonate (OSCAL) 1500 (600 Ca) MG TABS tablet, Take by mouth 2 (two) times daily with a meal., Disp: , Rfl:    Cholecalciferol 125 MCG (5000 UT) TABS, Take by mouth., Disp: , Rfl:    Cranberry 300 MG tablet, Take by mouth., Disp: ,  Rfl:    Cranberry 500 MG TABS, Take by mouth., Disp: , Rfl:    cyclobenzaprine (FLEXERIL) 10 MG tablet, Take 1 tablet (10 mg total) by mouth 3 (three) times daily as needed for muscle spasms., Disp: 30 tablet, Rfl: 0   etodolac (LODINE) 400 MG tablet, Take 1 tablet (400 mg total) by mouth 2 (two) times daily., Disp: 60 tablet, Rfl: 5   ibuprofen (ADVIL) 400 MG tablet, Take by mouth., Disp: , Rfl:    levothyroxine (SYNTHROID) 175 MCG tablet, TAKE 1 TABLET BY MOUTH DAILY BEFORE BREAKFAST., Disp: 30 tablet, Rfl: 5   metroNIDAZOLE (FLAGYL) 500 MG tablet, Take 2 tabs BID for 1 day, Disp: 4 tablet, Rfl: 0   Multiple Vitamin (MULTIVITAMIN) capsule, Take by mouth., Disp: , Rfl:    nitrofurantoin, macrocrystal-monohydrate, (MACROBID) 100 MG capsule, Take 1 capsule (100 mg total) by mouth 2 (two) times daily., Disp: 10 capsule, Rfl: 0   nortriptyline (PAMELOR) 25 MG capsule, TAKE 1 CAPSULE BY MOUTH EVERYDAY AT BEDTIME, Disp: 90 capsule, Rfl: 2   Ocrelizumab (OCREVUS IV), Inject into the vein. Every 6 mths, Disp: , Rfl:    oxybutynin (DITROPAN-XL) 10 MG 24 hr tablet, Take 1 tablet (10 mg total) by mouth at bedtime., Disp: 90 tablet, Rfl: 3   pregabalin (LYRICA) 75 MG capsule,  Take 1 capsule (75 mg total) by mouth 2 (two) times daily., Disp: 60 capsule, Rfl: 5   No Known Allergies   Review of Systems  Constitutional: Negative.   Respiratory: Negative.    Cardiovascular: Negative.   Gastrointestinal:  Negative for abdominal pain.  Musculoskeletal:  Positive for back pain.  Neurological: Negative.  Negative for weakness, numbness, headaches and paresthesias.  Psychiatric/Behavioral: Negative.       Today's Vitals   02/10/22 0823  BP: 110/78  Pulse: 91  Temp: 98.1 F (36.7 C)  SpO2: 98%  Weight: 266 lb 12.8 oz (121 kg)  Height: 5\' 5"  (1.651 m)   Body mass index is 44.4 kg/m.   Objective:  Physical Exam Vitals reviewed.  Constitutional:      General: She is not in acute distress.     Appearance: Normal appearance. She is well-developed. She is obese.  HENT:     Head: Normocephalic and atraumatic.  Eyes:     Pupils: Pupils are equal, round, and reactive to light.  Cardiovascular:     Rate and Rhythm: Normal rate and regular rhythm.     Pulses: Normal pulses.     Heart sounds: Normal heart sounds. No murmur heard. Pulmonary:     Effort: Pulmonary effort is normal. No respiratory distress.     Breath sounds: Normal breath sounds. No wheezing.  Abdominal:     Tenderness: There is right CVA tenderness and left CVA tenderness.  Musculoskeletal:        General: Tenderness (bilateral flank areas) present. No swelling. Normal range of motion.  Skin:    General: Skin is warm and dry.     Capillary Refill: Capillary refill takes less than 2 seconds.  Neurological:     General: No focal deficit present.     Mental Status: She is alert and oriented to person, place, and time.     Cranial Nerves: No cranial nerve deficit.     Motor: No weakness.  Psychiatric:        Mood and Affect: Mood normal.        Behavior: Behavior normal.        Thought Content: Thought content normal.        Judgment: Judgment normal.         Assessment And Plan:     1. Midline low back pain without sciatica, unspecified chronicity Comments: Urinalysis is negative for leukocytes, blood or nitrates. Will send for urine culture and treat empirically. Muscle relaxer given as well avoid taking with Nortriptyline and cyclobenzaprine together. She is to also not drive or operate heavy machinery while taking cyclobenzaprine.  - POCT Urinalysis Dipstick (81002) - Culture, Urine - nitrofurantoin, macrocrystal-monohydrate, (MACROBID) 100 MG capsule; Take 1 capsule (100 mg total) by mouth 2 (two) times daily.  Dispense: 10 capsule; Refill: 0 - cyclobenzaprine (FLEXERIL) 10 MG tablet; Take 1 tablet (10 mg total) by mouth 3 (three) times daily as needed for muscle spasms.  Dispense: 30 tablet; Refill:  0  2. 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) She is encouraged to strive for BMI less than 30 to decrease cardiac risk. Advised to aim for at least 150 minutes of exercise per week.     Patient was given opportunity to ask questions. Patient verbalized understanding of the plan and was able to repeat key elements of the plan. All questions were answered to their satisfaction.  [facility], FNP  Teola Bradley, FNP, have reviewed all documentation for this visit. The documentation on 02/10/22 for the exam, diagnosis, procedures, and orders are all accurate and complete.   IF YOU HAVE BEEN REFERRED TO A SPECIALIST, IT MAY TAKE 1-2 WEEKS TO SCHEDULE/PROCESS THE REFERRAL. IF YOU HAVE NOT HEARD FROM US/SPECIALIST IN TWO WEEKS, PLEASE GIVE [place] A CALL AT [phone] X [phone].   THE PATIENT IS ENCOURAGED TO PRACTICE SOCIAL DISTANCING DUE TO THE COVID-19 PANDEMIC.

## 2022-02-10 NOTE — Patient Instructions (Addendum)
Acute Back Pain, Adult Acute back pain is sudden and usually short-lived. It is often caused by an injury to the muscles and tissues in the back. The injury may result from: A muscle, tendon, or ligament getting overstretched or torn. Ligaments are tissues that connect bones to each other. Lifting something improperly can cause a back strain. Wear and tear (degeneration) of the spinal disks. Spinal disks are circular tissue that provide cushioning between the bones of the spine (vertebrae). Twisting motions, such as while playing sports or doing yard work. A hit to the back. Arthritis. You may have a physical exam, lab tests, and imaging tests to find the cause of your pain. Acute back pain usually goes away with rest and home care. Follow these instructions at home: Managing pain, stiffness, and swelling Take over-the-counter and prescription medicines only as told by your health care provider. Treatment may include medicines for pain and inflammation that are taken by mouth or applied to the skin, or muscle relaxants. Your health care provider may recommend applying ice during the first 24-48 hours after your pain starts. To do this: Put ice in a plastic bag. Place a towel between your skin and the bag. Leave the ice on for 20 minutes, 2-3 times a day. Remove the ice if your skin turns bright red. This is very important. If you cannot feel pain, heat, or cold, you have a greater risk of damage to the area. If directed, apply heat to the affected area as often as told by your health care provider. Use the heat source that your health care provider recommends, such as a moist heat pack or a heating pad. Place a towel between your skin and the heat source. Leave the heat on for 20-30 minutes. Remove the heat if your skin turns bright red. This is especially important if you are unable to feel pain, heat, or cold. You have a greater risk of getting burned. Activity  Do not stay in bed. Staying in  bed for more than 1-2 days can delay your recovery. Sit up and stand up straight. Avoid leaning forward when you sit or hunching over when you stand. If you work at a desk, sit close to it so you do not need to lean over. Keep your chin tucked in. Keep your neck drawn back, and keep your elbows bent at a 90-degree angle (right angle). Sit high and close to the steering wheel when you drive. Add lower back (lumbar) support to your car seat, if needed. Take short walks on even surfaces as soon as you are able. Try to increase the length of time you walk each day. Do not sit, drive, or stand in one place for more than 30 minutes at a time. Sitting or standing for long periods of time can put stress on your back. Do not drive or use heavy machinery while taking prescription pain medicine. Use proper lifting techniques. When you bend and lift, use positions that put less stress on your back: Bend your knees. Keep the load close to your body. Avoid twisting. Exercise regularly as told by your health care provider. Exercising helps your back heal faster and helps prevent back injuries by keeping muscles strong and flexible. Work with a physical therapist to make a safe exercise program, as recommended by your health care provider. Do any exercises as told by your physical therapist. Lifestyle Maintain a healthy weight. Extra weight puts stress on your back and makes it difficult to have good   posture. Avoid activities or situations that make you feel anxious or stressed. Stress and anxiety increase muscle tension and can make back pain worse. Learn ways to manage anxiety and stress, such as through exercise. General instructions Sleep on a firm mattress in a comfortable position. Try lying on your side with your knees slightly bent. If you lie on your back, put a pillow under your knees. Keep your head and neck in a straight line with your spine (neutral position) when using electronic equipment like  smartphones or pads. To do this: Raise your smartphone or pad to look at it instead of bending your head or neck to look down. Put the smartphone or pad at the level of your face while looking at the screen. Follow your treatment plan as told by your health care provider. This may include: Cognitive or behavioral therapy. Acupuncture or massage therapy. Meditation or yoga. Contact a health care provider if: You have pain that is not relieved with rest or medicine. You have increasing pain going down into your legs or buttocks. Your pain does not improve after 2 weeks. You have pain at night. You lose weight without trying. You have a fever or chills. You develop nausea or vomiting. You develop abdominal pain. Get help right away if: You develop new bowel or bladder control problems. You have unusual weakness or numbness in your arms or legs. You feel faint. These symptoms may represent a serious problem that is an emergency. Do not wait to see if the symptoms will go away. Get medical help right away. Call your local emergency services (911 in the [place] Do not drive yourself to the hospital. Summary Acute back pain is sudden and usually short-lived. Use proper lifting techniques. When you bend and lift, use positions that put less stress on your back. Take over-the-counter and prescription medicines only as told by your health care provider, and apply heat or ice as told. This information is not intended to replace advice given to you by your health care provider. Make sure you discuss any questions you have with your health care provider. Document Revised: 06/21/2020 Document Reviewed: 06/21/2020 Elsevier Patient Education  2023 Elsevier Inc.   Muscle Cramps and Spasms Muscle cramps and spasms occur when a muscle or muscles tighten and you have no control over this tightening (involuntary muscle contraction). They are a common problem and can develop in any muscle. The most common  place is in the calf muscles of the leg. Muscle cramps and muscle spasms are both involuntary muscle contractions, but there are some differences between the two: Muscle cramps are painful. They come and go and may last for a few seconds or up to 15 minutes. Muscle cramps are often more forceful and last longer than muscle spasms. Muscle spasms may or may not be painful. They may also last just a few seconds or much longer. Certain medical conditions, such as diabetes or Parkinson's disease, can make it more likely to develop cramps or spasms. However, cramps or spasms are usually not caused by a serious underlying problem. Common causes include: Doing more physical work or exercise than your body is ready for (overexertion). Overuse from repeating certain movements too many times. Remaining in a certain position for a long period of time. Improper preparation, form, or technique while playing a sport or doing an activity. Dehydration. Injury. Side effects of some medicines. Abnormally low levels of the salts and minerals in your blood (electrolytes), especially potassium and calcium. This could  happen if you are taking water pills (diuretics) or if you are pregnant. In many cases, the cause of muscle cramps or spasms is not known. Follow these instructions at home: Managing pain and stiffness     Try massaging, stretching, and relaxing the affected muscle. Do this for several minutes at a time. If directed, apply heat to tight or tense muscles as often as told by your health care provider. Use the heat source that your health care provider recommends, such as a moist heat pack or a heating pad. Place a towel between your skin and the heat source. Leave the heat on for 20-30 minutes. Remove the heat if your skin turns bright red. This is especially important if you are unable to feel pain, heat, or cold. You may have a greater risk of getting burned. If directed, put ice on the affected area.  This may help if you are sore or have pain after a cramp or spasm. Put ice in a plastic bag. Place a towel between your skin and the bag. Leave the ice on for 20 minutes, 2-3 times a day. Try taking hot showers or baths to help relax tight muscles. Eating and drinking Drink enough fluid to keep your urine pale yellow. Staying well hydrated may help prevent cramps or spasms. Eat a healthy diet that includes plenty of nutrients to help your muscles function. A healthy diet includes fruits and vegetables, lean protein, whole grains, and low-fat or nonfat dairy products. General instructions If you are having frequent cramps, avoid intense exercise for several days. Take over-the-counter and prescription medicines only as told by your health care provider. Pay attention to any changes in your symptoms. Keep all follow-up visits as told by your health care provider. This is important. Contact a health care provider if: Your cramps or spasms get more severe or happen more often. Your cramps or spasms do not improve over time. Summary Muscle cramps and spasms occur when a muscle or muscles tighten and you have no control over this tightening (involuntary muscle contraction). The most common place for cramps or spasms to occur is in the calf muscles of the leg. Massaging, stretching, and relaxing the affected muscle may relieve the cramp or spasm. Drink enough fluid to keep your urine pale yellow. Staying well hydrated may help prevent cramps or spasms. This information is not intended to replace advice given to you by your health care provider. Make sure you discuss any questions you have with your health care provider. Document Revised: 10/18/2020 Document Reviewed: 10/18/2020 Elsevier Patient Education  [place] not take cyclobenzaprine and nortriptylline together

## 2022-03-23 ENCOUNTER — Other Ambulatory Visit: Payer: Self-pay | Admitting: Neurology

## 2022-03-27 ENCOUNTER — Other Ambulatory Visit: Payer: Self-pay | Admitting: Neurology

## 2022-03-31 ENCOUNTER — Telehealth: Payer: Self-pay | Admitting: *Deleted

## 2022-03-31 ENCOUNTER — Other Ambulatory Visit: Payer: Self-pay | Admitting: *Deleted

## 2022-03-31 DIAGNOSIS — Z1211 Encounter for screening for malignant neoplasm of colon: Secondary | ICD-10-CM

## 2022-03-31 MED ORDER — PEG 3350-KCL-NABCB-NACL-NASULF 236 G PO SOLR: 4000.0000 mL | Freq: Once | ORAL | 0 refills | Status: AC

## 2022-03-31 NOTE — Telephone Encounter (Signed)
Patient left a voicemail stating that she need to reschedule her colonoscopy.   I have spoken to patient and it shows that she spoken to [facility] at endo unit, the procedure was canceled. We have reschedule the colonoscopy to 06/12/2022 as requested.    New instructions with new dates will be sent. Patient verbalized understanding.

## 2022-04-03 ENCOUNTER — Ambulatory Visit: Admit: 2022-04-03 | Payer: BLUE CROSS/BLUE SHIELD | Admitting: Gastroenterology

## 2022-04-03 SURGERY — COLONOSCOPY WITH PROPOFOL
Anesthesia: General

## 2022-04-07 ENCOUNTER — Encounter: Payer: Self-pay | Admitting: Neurology

## 2022-04-07 ENCOUNTER — Ambulatory Visit: Payer: 59 | Admitting: Neurology

## 2022-04-07 VITALS — BP 149/98 | HR 96 | Ht 65.0 in | Wt 269.0 lb

## 2022-04-07 DIAGNOSIS — Z79899 Other long term (current) drug therapy: Secondary | ICD-10-CM | POA: Diagnosis not present

## 2022-04-07 DIAGNOSIS — G35 Multiple sclerosis: Secondary | ICD-10-CM

## 2022-04-07 DIAGNOSIS — G5603 Carpal tunnel syndrome, bilateral upper limbs: Secondary | ICD-10-CM | POA: Diagnosis not present

## 2022-04-07 DIAGNOSIS — M62838 Other muscle spasm: Secondary | ICD-10-CM

## 2022-04-07 DIAGNOSIS — R200 Anesthesia of skin: Secondary | ICD-10-CM | POA: Diagnosis not present

## 2022-04-07 MED ORDER — TIZANIDINE HCL 4 MG PO TABS: ORAL_TABLET | ORAL | 11 refills | Status: DC

## 2022-04-07 NOTE — Progress Notes (Signed)
[facility]  PATIENT: Samantha [name] DOB: 1971/04/05  REFERRING DOCTOR OR PCP: Darden Amber, PA-C SOURCE: Patient, notes from [facility] neurology [place] (Dr. Moshe Cipro), imaging and laboratory reports, MRI images ere personally reviewed  _________________________________   HISTORICAL  CHIEF COMPLAINT:  Chief Complaint  Patient presents with   Follow-up    Pt in room #10 and alone. Pt here today to f/u on her MS.    HISTORY OF PRESENT ILLNESS:  Samantha [name] is a 51 y.o. woman with relapsing remitting multiple sclerosis diagnosed in 2012  Update 04/07/2022: She is currently on Ocrevus.  She is tolerating it well.  When checked June 2023, IgG/IgM was normal.  CD19/20 was near 0.  No exacerbation but she notes more numbness in her right > left hand.  It fluctuates.  There is numbness >> pain.  She has to be careful with grips as she has dropped items.     She has a T2 hyperintense focus at C2.  She notes more stiffness and pain in legs if she sits a long time.   This is also painful when she stands up.  She will get spasms in her legs when she sleeps that will sometimes awaken her  Currently she is having dysesthesias and deeper leg pain.    Sometimes pain is  like being stabbed.    Spasms in her calf sometimes awaken her.  Gabapentin had not helped    Nortriptyline has not helped much.   Lyrica only helped a little bit.   She is on baclofen 20 mg tid.    It helps muscle spasticity some.    Gait is doing well and she has mildly reduced balance.   She holds on to  the bannister going downstairs.    She has urinary urgency helped by oxybutynin.       She has more stress (in [place] of divorcee).     No depression.   She works third shift (11 pm to 7am) and sleeps poorly     Cognition is usually ok but has some word finding difficulties.    MS history:  She was diagnosed with MS around 2012.   She woke up with numbness in her left face and tongue.    She had an  MRI of the brain and was diagnosed with multiple sclerosis.   She was diagnosed in [place], [place].  She received IV steroids and numbness improved over time.    She was placed on Tecfidera x a couple years but had a relapse and then was placed on Plegridy.   She had difficulty with injections so switched to [place] in 2019.   Her MS has done well and she has not had any exacerbations.     She is currently getting infusions at [facility], [place].     She has been seeing Dr. Moshe Cipro the past 3 years at [facility] but insurance company is making her switch.   IMAGING: Personally reviewed today MRI of the brain 08/12/2020 shows multiple T2/Flair hyperintense foci in the periventrixular, juxtacotical and deep white matter and 2 foci in cerebellar hemisphere.   No enhancement    No change (by report) compared to 2020.  MRI of the cervical spine 03/17/2017 shows a T2 hyperintense focus at C2.   It was otherwise normal.  No enhancing lesions.  LABS: IgG was normal.  IgM borderline (57 at [facility]), Vit D was normal.  QuantiFERON TB and hepatitis chronic infection  labs were normal in 2017.  REVIEW OF SYSTEMS: Constitutional: No fevers, chills, sweats, or change in appetite Eyes: No visual changes, double vision, eye pain Ear, nose and throat: No hearing loss, ear pain, nasal congestion, sore throat Cardiovascular: No chest pain, palpitations Respiratory:  No shortness of breath at rest or with exertion.   No wheezes GastrointestinaI: No nausea, vomiting, diarrhea, abdominal pain, fecal incontinence Genitourinary:  No dysuria, urinary retention or frequency.  No nocturia. Musculoskeletal:  No neck pain, back pain Integumentary: No rash, pruritus, skin lesions Neurological: as above Psychiatric: No depression at this time.  No anxiety Endocrine: No palpitations, diaphoresis, change in appetite, change in weigh or increased thirst Hematologic/Lymphatic:  No anemia, purpura,  petechiae. Allergic/Immunologic: No itchy/runny eyes, nasal congestion, recent allergic reactions, rashes  ALLERGIES: No Known Allergies  HOME MEDICATIONS:  Current Outpatient Medications:    b complex vitamins capsule, Take 1 capsule by mouth daily., Disp: , Rfl:    baclofen (LIORESAL) 10 MG tablet, Take 1 tablet (10 mg total) by mouth 3 (three) times daily., Disp: 90 each, Rfl: 5   calcium carbonate (OSCAL) 1500 (600 Ca) MG TABS tablet, Take by mouth 2 (two) times daily with a meal., Disp: , Rfl:    Cholecalciferol 125 MCG (5000 UT) TABS, Take by mouth., Disp: , Rfl:    Cranberry 300 MG tablet, Take by mouth., Disp: , Rfl:    Cranberry 500 MG TABS, Take by mouth., Disp: , Rfl:    etodolac (LODINE) 400 MG tablet, TAKE 1 TABLET BY MOUTH TWICE A DAY, Disp: 60 tablet, Rfl: 5   ibuprofen (ADVIL) 400 MG tablet, Take by mouth., Disp: , Rfl:    levothyroxine (SYNTHROID) 175 MCG tablet, TAKE 1 TABLET BY MOUTH DAILY BEFORE BREAKFAST., Disp: 30 tablet, Rfl: 5   metroNIDAZOLE (FLAGYL) 500 MG tablet, Take 2 tabs BID for 1 day, Disp: 4 tablet, Rfl: 0   Multiple Vitamin (MULTIVITAMIN) capsule, Take by mouth., Disp: , Rfl:    nitrofurantoin, macrocrystal-monohydrate, (MACROBID) 100 MG capsule, Take 1 capsule (100 mg total) by mouth 2 (two) times daily., Disp: 10 capsule, Rfl: 0   nortriptyline (PAMELOR) 25 MG capsule, TAKE 1 CAPSULE BY MOUTH EVERYDAY AT BEDTIME, Disp: 90 capsule, Rfl: 2   Ocrelizumab (OCREVUS IV), Inject into the vein. Every 6 mths, Disp: , Rfl:    oxybutynin (DITROPAN-XL) 10 MG 24 hr tablet, TAKE ONE TABLET BY MOUTH AT BEDTIME, Disp: 90 tablet, Rfl: 3   pregabalin (LYRICA) 75 MG capsule, Take 1 capsule (75 mg total) by mouth 2 (two) times daily., Disp: 60 capsule, Rfl: 5   tiZANidine (ZANAFLEX) 4 MG tablet, 1/2 to 1 po tid, Disp: 90 tablet, Rfl: 11  PAST MEDICAL HISTORY: Past Medical History:  Diagnosis Date   H/O shoulder surgery    Hypothyroid    Multiple sclerosis (HCC)     Obese     PAST SURGICAL HISTORY: Past Surgical History:  Procedure Laterality Date   SHOULDER ARTHROSCOPY Left    THYROIDECTOMY     TONSILLECTOMY      FAMILY HISTORY: Family History  Problem Relation Age of Onset   Ovarian cancer Mother    Breast cancer Mother    Cervical cancer Mother     SOCIAL HISTORY:  Social History   Socioeconomic History   Marital status: Married    Spouse name: Not on file   Number of children: Not on file   Years of education: Not on file   Highest education level: Not  on file  Occupational History   Not on file  Tobacco Use   Smoking status: Never   Smokeless tobacco: Never  Vaping Use   Vaping Use: Never used  Substance and Sexual Activity   Alcohol use: Yes    Comment: social   Drug use: No   Sexual activity: Yes    Birth control/protection: I.U.D.    Comment: Mirena  Other Topics Concern   Not on file  Social History Narrative   Not on file   Social Determinants of Health   Financial Resource Strain: Not on file  Food Insecurity: Not on file  Transportation Needs: Not on file  Physical Activity: Not on file  Stress: Not on file  Social Connections: Not on file  Intimate Partner Violence: Not on file     PHYSICAL EXAM  Vitals:   04/07/22 1256  BP: (!) 149/98  Pulse: 96  Weight: 269 lb (122 kg)  Height: 5\' 5"  (1.651 m)    Body mass index is 44.76 kg/m.   General: The patient is well-developed and well-nourished and in no acute distress  HEENT:  Head is [place]/AT.  Sclera are anicteric.     Neck: Good range of motion.  The neck is nontender.   Skin: Extremities are without rash or  edema.  Musculoskeletal:  Back is nontender  Neurologic Exam  Mental status: The patient is alert and oriented x 3 at the time of the examination. The patient has apparent normal recent and remote memory, with an apparently normal attention span and concentration ability.   Speech is normal.  Cranial nerves: Extraocular movements  are full.  Facial strength and sensation was normal.  No dysarthria.. No obvious hearing deficits are noted.  Motor:  Muscle bulk is normal.   Tone is slightly increased in legs.. Strength is  5 / 5 in all 4 extremities.   Sensory: Sensory testing is intact to pinprick, soft touch and vibration sensation in all 4 extremities except slight reduced sensation in the right median distribution  Coordination: Cerebellar testing reveals good finger-nose-finger and heel-to-shin bilaterally.  Gait and station: Station is normal.   Gait is spastic and mildly wide. Tandem gait is wide.  . Romberg is negative.   Reflexes: Deep tendon reflexes are symmetric and normal bilaterally.       Lab Results  Component Value Date   TSH 1.320 12/11/2021       ASSESSMENT AND PLAN  Multiple sclerosis ([place]) - Plan: IgG, IgA, IgM, CBC with Differential/Platelet, CD20 B Cells  Bilateral carpal tunnel syndrome - Plan: NCV with EMG(electromyography)  Numbness - Plan: NCV with EMG(electromyography)  High risk medication use - Plan: IgG, IgA, IgM, CBC with Differential/Platelet, CD20 B Cells  Muscle spasticity  1.  She will continue Ocrevus.  We will check some lab work today.   2.   Add tizanidine for leg spasticity.   Since Lyrica has not helped, she will stop.  3.   NCV/EMG for CTS vs radiculopathy vs MS 4.    Return in 6 months or sooner if there are new or worsening neurologic symptoms.  Lyssa Hackley A. Felecia Shelling, MD, [facility] [id number], [id number] PM Certified in Neurology, Clinical Neurophysiology, Sleep Medicine and Neuroimaging  [facility] [address] [place], [place] [address] [phone]

## 2022-04-08 LAB — CBC WITH DIFFERENTIAL/PLATELET
Basophils Absolute: 0 10*3/uL (ref 0.0–0.2)
Basos: 0 %
EOS (ABSOLUTE): 0.3 10*3/uL (ref 0.0–0.4)
Eos: 3 %
Hematocrit: 41.8 % (ref 34.0–46.6)
Hemoglobin: 14 g/dL (ref 11.1–15.9)
Immature Grans (Abs): 0 10*3/uL (ref 0.0–0.1)
Immature Granulocytes: 0 %
Lymphocytes Absolute: 1.6 10*3/uL (ref 0.7–3.1)
Lymphs: 14 %
MCH: 29.1 pg (ref 26.6–33.0)
MCHC: 33.5 g/dL (ref 31.5–35.7)
MCV: 87 fL (ref 79–97)
Monocytes Absolute: 0.9 10*3/uL (ref 0.1–0.9)
Monocytes: 8 %
Neutrophils Absolute: 8.7 10*3/uL — ABNORMAL HIGH (ref 1.4–7.0)
Neutrophils: 75 %
Platelets: 324 10*3/uL (ref 150–450)
RBC: 4.81 x10E6/uL (ref 3.77–5.28)
RDW: 12.1 % (ref 11.7–15.4)
WBC: 11.6 10*3/uL — ABNORMAL HIGH (ref 3.4–10.8)

## 2022-04-08 LAB — IGG, IGA, IGM
IgA/Immunoglobulin A, Serum: 159 mg/dL (ref 87–352)
IgG (Immunoglobin G), Serum: 926 mg/dL (ref 586–1602)
IgM (Immunoglobulin M), Srm: 45 mg/dL (ref 26–217)

## 2022-04-08 LAB — CD20 B CELLS
% CD19-B Cells: 3.4 % — ABNORMAL LOW (ref 4.6–22.1)
% CD20-B Cells: 3.4 % — ABNORMAL LOW (ref 5.0–22.3)

## 2022-04-30 ENCOUNTER — Encounter: Payer: 59 | Admitting: Neurology

## 2022-04-30 ENCOUNTER — Telehealth: Payer: Self-pay | Admitting: Neurology

## 2022-04-30 NOTE — Telephone Encounter (Signed)
Provider out r/s

## 2022-05-20 ENCOUNTER — Telehealth: Payer: Self-pay | Admitting: Neurology

## 2022-05-20 NOTE — Telephone Encounter (Signed)
[employer]  is asking for a call re: what is the amount of Ocrevus administered to pt on 11-10-21 when calling please use ref#[id number] .  Also wants to know if it was a Human resources officer or a Psychologist, prison and probation services.

## 2022-05-20 NOTE — Telephone Encounter (Signed)
Sent message to [facility] asking they follow up on this

## 2022-05-21 ENCOUNTER — Other Ambulatory Visit: Payer: Self-pay | Admitting: Nurse Practitioner

## 2022-05-21 DIAGNOSIS — E039 Hypothyroidism, unspecified: Secondary | ICD-10-CM

## 2022-05-25 ENCOUNTER — Encounter: Payer: Self-pay | Admitting: Nurse Practitioner

## 2022-05-25 ENCOUNTER — Ambulatory Visit: Payer: 59 | Admitting: Nurse Practitioner

## 2022-05-25 VITALS — BP 142/88 | HR 102 | Temp 98.7°F | Ht 65.0 in | Wt 257.0 lb

## 2022-05-25 DIAGNOSIS — J029 Acute pharyngitis, unspecified: Secondary | ICD-10-CM

## 2022-05-25 DIAGNOSIS — R051 Acute cough: Secondary | ICD-10-CM

## 2022-05-25 DIAGNOSIS — J069 Acute upper respiratory infection, unspecified: Secondary | ICD-10-CM

## 2022-05-25 LAB — POCT RAPID STREP A (OFFICE): Rapid Strep A Screen: NEGATIVE

## 2022-05-25 MED ORDER — AMOXICILLIN 875 MG PO TABS: 875.0000 mg | ORAL_TABLET | Freq: Two times a day (BID) | ORAL | 0 refills | Status: DC

## 2022-05-25 MED ORDER — PREDNISONE 10 MG (21) PO TBPK: ORAL_TABLET | ORAL | 0 refills | Status: DC

## 2022-05-25 MED ORDER — HYDROCODONE BIT-HOMATROP MBR 5-1.5 MG/5ML PO SOLN: 5.0000 mL | Freq: Four times a day (QID) | ORAL | 0 refills | Status: DC | PRN

## 2022-05-25 NOTE — Progress Notes (Signed)
I,[name],acting as a Education administrator for [name], FNP.,have documented all relevant documentation on the behalf of [name], FNP,as directed by  [name], FNP while in the presence of [name], [place].    Subjective:     Patient ID: Samantha [name] , female    DOB: 11-17-1970 , 52 y.o.   MRN: [id number]   Chief Complaint  Patient presents with   Sore Throat   Nasal Congestion   Otalgia    right    HPI  Patient presents today for sore throat since last week painful to swallow. Feels like has a hot poker to her ear.  She also reports right ear pain, nasal congestion and cough. She denies fever/chills/nausea/emesis.    Sore Throat  This is a new problem. The current episode started in the past 7 days. There has been no fever. Associated symptoms include congestion (right side of her nose) and ear pain. Treatments tried: Dayquil/nyquil.     Past Medical History:  Diagnosis Date   H/O shoulder surgery    Hypothyroid    Multiple sclerosis ([place])    Obese      Family History  Problem Relation Age of Onset   Ovarian cancer Mother    Breast cancer Mother    Cervical cancer Mother      Current Outpatient Medications:    amoxicillin (AMOXIL) 875 MG tablet, Take 1 tablet (875 mg total) by mouth 2 (two) times daily., Disp: 14 tablet, Rfl: 0   HYDROcodone bit-homatropine (HYDROMET) 5-1.5 MG/5ML syrup, Take 5 mLs by mouth every 6 (six) hours as needed for cough., Disp: 120 mL, Rfl: 0   lamoTRIgine (LAMICTAL) 25 MG tablet, Take 50 mg by mouth 2 (two) times daily., Disp: , Rfl:    predniSONE (STERAPRED UNI-PAK 21 TAB) 10 MG (21) TBPK tablet, Take as directed, Disp: 21 tablet, Rfl: 0   b complex vitamins capsule, Take 1 capsule by mouth daily., Disp: , Rfl:    calcium carbonate (OSCAL) 1500 (600 Ca) MG TABS tablet, Take by mouth 2 (two) times daily with a meal., Disp: , Rfl:    Cholecalciferol 125 MCG (5000 UT) TABS, Take by mouth., Disp: , Rfl:    Cranberry 500 MG  TABS, Take by mouth., Disp: , Rfl:    ibuprofen (ADVIL) 400 MG tablet, Take by mouth., Disp: , Rfl:    levothyroxine (SYNTHROID) 175 MCG tablet, TAKE 1 TABLET BY MOUTH EVERY DAY BEFORE BREAKFAST, Disp: 30 tablet, Rfl: 5   Multiple Vitamin (MULTIVITAMIN) capsule, Take by mouth., Disp: , Rfl:    nortriptyline (PAMELOR) 25 MG capsule, TAKE 1 CAPSULE BY MOUTH EVERYDAY AT BEDTIME, Disp: 90 capsule, Rfl: 2   Ocrelizumab (OCREVUS IV), Inject into the vein. Every 6 mths, Disp: , Rfl:    oxybutynin (DITROPAN-XL) 10 MG 24 hr tablet, TAKE ONE TABLET BY MOUTH AT BEDTIME, Disp: 90 tablet, Rfl: 3   pregabalin (LYRICA) 75 MG capsule, Take 1 capsule (75 mg total) by mouth 2 (two) times daily., Disp: 60 capsule, Rfl: 5   tiZANidine (ZANAFLEX) 4 MG tablet, 1/2 to 1 po tid, Disp: 90 tablet, Rfl: 11   No Known Allergies   Review of Systems  HENT:  Positive for congestion (right side of her nose), ear pain, rhinorrhea and sore throat.   All other systems reviewed and are negative.    Today's Vitals   05/25/22 1512  BP: (!) 142/88  Pulse: (!) 102  Temp: 98.7 F (37.1 C)  TempSrc:  Oral  SpO2: 94%  Weight: 257 lb (116.6 kg)  Height: 5' 5"$  (1.651 m)   Body mass index is 42.77 kg/m.   Objective:  Physical Exam Vitals reviewed.  Constitutional:      General: She is not in acute distress.    Appearance: She is well-developed. She is obese.  HENT:     Right Ear: Tympanic membrane and ear canal normal. No tenderness. Tympanic membrane is not erythematous.     Left Ear: Tympanic membrane and ear canal normal. No tenderness. Tympanic membrane is not erythematous.     Nose: Congestion (right nare) present.     Mouth/Throat:     Mouth: Mucous membranes are moist.     Pharynx: Posterior oropharyngeal erythema present. No pharyngeal swelling.  Cardiovascular:     Rate and Rhythm: Normal rate and regular rhythm.     Heart sounds: Normal heart sounds. No murmur heard. Skin:    General: Skin is warm and  dry.     Capillary Refill: Capillary refill takes less than 2 seconds.  Neurological:     General: No focal deficit present.     Mental Status: She is alert and oriented to person, place, and time.  Psychiatric:        Mood and Affect: Mood normal.        Behavior: Behavior normal.         Assessment And Plan:     1. Sore throat Comments: Negative rapid strep, her oropharynx is erythematous will treat for pharygitis. - POCT rapid strep A - amoxicillin (AMOXIL) 875 MG tablet; Take 1 tablet (875 mg total) by mouth 2 (two) times daily.  Dispense: 14 tablet; Refill: 0 - predniSONE (STERAPRED UNI-PAK 21 TAB) 10 MG (21) TBPK tablet; Take as directed  Dispense: 21 tablet; Refill: 0 - HYDROcodone bit-homatropine (HYDROMET) 5-1.5 MG/5ML syrup; Take 5 mLs by mouth every 6 (six) hours as needed for cough.  Dispense: 120 mL; Refill: 0  2. Acute cough Comments: Hacking cough will treat with steroid. - amoxicillin (AMOXIL) 875 MG tablet; Take 1 tablet (875 mg total) by mouth 2 (two) times daily.  Dispense: 14 tablet; Refill: 0 - predniSONE (STERAPRED UNI-PAK 21 TAB) 10 MG (21) TBPK tablet; Take as directed  Dispense: 21 tablet; Refill: 0 - HYDROcodone bit-homatropine (HYDROMET) 5-1.5 MG/5ML syrup; Take 5 mLs by mouth every 6 (six) hours as needed for cough.  Dispense: 120 mL; Refill: 0  3. Upper respiratory tract infection, unspecified type - amoxicillin (AMOXIL) 875 MG tablet; Take 1 tablet (875 mg total) by mouth 2 (two) times daily.  Dispense: 14 tablet; Refill: 0 - predniSONE (STERAPRED UNI-PAK 21 TAB) 10 MG (21) TBPK tablet; Take as directed  Dispense: 21 tablet; Refill: 0 - HYDROcodone bit-homatropine (HYDROMET) 5-1.5 MG/5ML syrup; Take 5 mLs by mouth every 6 (six) hours as needed for cough.  Dispense: 120 mL; Refill: 0     Patient was given opportunity to ask questions. Patient verbalized understanding of the plan and was able to repeat key elements of the plan. All questions were answered  to their satisfaction.  [name], FNP   I, [name], FNP, have reviewed all documentation for this visit. The documentation on 05/25/22 for the exam, diagnosis, procedures, and orders are all accurate and complete.   IF YOU HAVE BEEN REFERRED TO A SPECIALIST, IT MAY TAKE 1-2 WEEKS TO SCHEDULE/PROCESS THE REFERRAL. IF YOU HAVE NOT HEARD FROM US/SPECIALIST IN TWO WEEKS, PLEASE GIVE [place] A CALL AT [phone]  X [phone].   THE PATIENT IS ENCOURAGED TO PRACTICE SOCIAL DISTANCING DUE TO THE COVID-19 PANDEMIC.

## 2022-06-09 ENCOUNTER — Encounter: Payer: Self-pay | Admitting: Nurse Practitioner

## 2022-06-09 ENCOUNTER — Ambulatory Visit (INDEPENDENT_AMBULATORY_CARE_PROVIDER_SITE_OTHER): Payer: 59 | Admitting: Nurse Practitioner

## 2022-06-09 VITALS — BP 128/82 | HR 90 | Temp 98.9°F | Ht 65.0 in | Wt 259.8 lb

## 2022-06-09 DIAGNOSIS — E661 Drug-induced obesity: Secondary | ICD-10-CM | POA: Diagnosis not present

## 2022-06-09 DIAGNOSIS — Z23 Encounter for immunization: Secondary | ICD-10-CM | POA: Diagnosis not present

## 2022-06-09 DIAGNOSIS — E538 Deficiency of other specified B group vitamins: Secondary | ICD-10-CM

## 2022-06-09 DIAGNOSIS — Z0000 Encounter for general adult medical examination without abnormal findings: Secondary | ICD-10-CM

## 2022-06-09 DIAGNOSIS — G35 Multiple sclerosis: Secondary | ICD-10-CM

## 2022-06-09 DIAGNOSIS — E7800 Pure hypercholesterolemia, unspecified: Secondary | ICD-10-CM

## 2022-06-09 DIAGNOSIS — G35D Multiple sclerosis, unspecified: Secondary | ICD-10-CM

## 2022-06-09 DIAGNOSIS — E039 Hypothyroidism, unspecified: Secondary | ICD-10-CM | POA: Diagnosis not present

## 2022-06-09 DIAGNOSIS — Z79899 Other long term (current) drug therapy: Secondary | ICD-10-CM

## 2022-06-09 DIAGNOSIS — E66813 Obesity, class 3: Secondary | ICD-10-CM

## 2022-06-09 DIAGNOSIS — Z6841 Body Mass Index (BMI) 40.0 and over, adult: Secondary | ICD-10-CM

## 2022-06-09 NOTE — Progress Notes (Signed)
Barnet Glasgow Martin,acting as a Education administrator for [name], FNP.,have documented all relevant documentation on the behalf of [name], FNP,as directed by  [name], FNP while in the presence of [name], [place].   Subjective:     Patient ID: Samantha [name] , female    DOB: 12-03-1970 , 52 y.o.   MRN: [id number]   Chief Complaint  Patient presents with   Annual Exam    HPI  Patient is here for HM. Patient states compliance with medications and has no other concerns today. Patient is established with GYN. She is scheduled for her colonoscopy on 06/12/2022. She works 3rd shift. Continue f/u with Dr Felecia Shelling (neurology), next appt in June. No exacerbations  BP Readings from Last 3 Encounters: 06/09/22 : 128/82 05/25/22 : (!) 142/88 04/07/22 : (!) 149/98       Past Medical History:  Diagnosis Date   H/O shoulder surgery    Hypothyroid    Multiple sclerosis ([place])    Obese    Scaly skin 07/22/2015   Urinary incontinence in female 05/05/2016     Family History  Problem Relation Age of Onset   Ovarian cancer Mother    Breast cancer Mother    Cervical cancer Mother      Current Outpatient Medications:    b complex vitamins capsule, Take 1 capsule by mouth daily., Disp: , Rfl:    calcium carbonate (OSCAL) 1500 (600 Ca) MG TABS tablet, Take by mouth 2 (two) times daily with a meal., Disp: , Rfl:    Cholecalciferol 125 MCG (5000 UT) TABS, Take by mouth., Disp: , Rfl:    Cranberry 500 MG TABS, Take by mouth., Disp: , Rfl:    HYDROcodone bit-homatropine (HYDROMET) 5-1.5 MG/5ML syrup, Take 5 mLs by mouth every 6 (six) hours as needed for cough., Disp: 120 mL, Rfl: 0   ibuprofen (ADVIL) 400 MG tablet, Take by mouth., Disp: , Rfl:    lamoTRIgine (LAMICTAL) 25 MG tablet, Take 50 mg by mouth 2 (two) times daily., Disp: , Rfl:    levothyroxine (SYNTHROID) 175 MCG tablet, TAKE 1 TABLET BY MOUTH EVERY DAY BEFORE BREAKFAST, Disp: 30 tablet, Rfl: 5   Multiple Vitamin (MULTIVITAMIN)  capsule, Take by mouth., Disp: , Rfl:    nortriptyline (PAMELOR) 25 MG capsule, TAKE 1 CAPSULE BY MOUTH EVERYDAY AT BEDTIME, Disp: 90 capsule, Rfl: 2   Ocrelizumab (OCREVUS IV), Inject into the vein. Every 6 mths, Disp: , Rfl:    oxybutynin (DITROPAN-XL) 10 MG 24 hr tablet, TAKE ONE TABLET BY MOUTH AT BEDTIME, Disp: 90 tablet, Rfl: 3   pregabalin (LYRICA) 75 MG capsule, Take 1 capsule (75 mg total) by mouth 2 (two) times daily., Disp: 60 capsule, Rfl: 5   tiZANidine (ZANAFLEX) 4 MG tablet, 1/2 to 1 po tid, Disp: 90 tablet, Rfl: 11   No Known Allergies    The patient states she uses IUD for birth control.  No LMP recorded (lmp unknown). (Menstrual status: IUD).. Negative for Dysmenorrhea and Negative for Menorrhagia. Negative for: breast discharge, breast lump(s), breast pain and breast self exam. Associated symptoms include abnormal vaginal bleeding. Pertinent negatives include abnormal bleeding (hematology), anxiety, decreased libido, depression, difficulty falling sleep, dyspareunia, history of infertility, nocturia, sexual dysfunction, sleep disturbances, urinary incontinence, urinary urgency, vaginal discharge and vaginal itching. Diet regular; she tries to avoid high acid foods. 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   The patient's tobacco use is:  Social History   Tobacco Use  Smoking Status Never  Smokeless Tobacco Never  . She has been exposed to passive smoke. The patient's alcohol use is:  Social History   Substance and Sexual Activity  Alcohol Use Yes   Comment: social   Additional information: Last pap 08/05/2021, next one scheduled for 08/06/2026.    Review of Systems  Constitutional: Negative.   HENT: Negative.    Eyes: Negative.   Respiratory: Negative.    Cardiovascular: Negative.   Gastrointestinal: Negative.   Endocrine: Negative.   Genitourinary: Negative.   Musculoskeletal: Negative.   Skin: Negative.    Allergic/Immunologic: Negative.   Neurological: Negative.   Hematological: Negative.   Psychiatric/Behavioral: Negative.       Today's Vitals   06/09/22 1408  BP: 128/82  Pulse: 90  Temp: 98.9 F (37.2 C)  TempSrc: Oral  Weight: 259 lb 12.8 oz (117.8 kg)  Height: 5\' 5"  (1.651 m)  PainSc: 0-No pain   Body mass index is 43.23 kg/m.  Wt Readings from Last 3 Encounters:  06/09/22 259 lb 12.8 oz (117.8 kg)  05/25/22 257 lb (116.6 kg)  04/07/22 269 lb (122 kg)    Objective:  Physical Exam Vitals reviewed.  Constitutional:      General: She is not in acute distress.    Appearance: Normal appearance. She is well-developed. She is obese.  HENT:     Head: Normocephalic and atraumatic.     Right Ear: Hearing, tympanic membrane, ear canal and external ear normal. There is no impacted cerumen.     Left Ear: Hearing, tympanic membrane, ear canal and external ear normal. There is no impacted cerumen.     Nose:     Comments: Deferred - masked    Mouth/Throat:     Comments: Deferred - masked Eyes:     General: Lids are normal.     Extraocular Movements: Extraocular movements intact.     Conjunctiva/sclera: Conjunctivae normal.     Pupils: Pupils are equal, round, and reactive to light.     Funduscopic exam:    Right eye: No papilledema.        Left eye: No papilledema.  Neck:     Thyroid: No thyroid mass.     Vascular: No carotid bruit.  Cardiovascular:     Rate and Rhythm: Normal rate and regular rhythm.     Pulses: Normal pulses.     Heart sounds: Normal heart sounds. No murmur heard. Pulmonary:     Effort: Pulmonary effort is normal. No respiratory distress.     Breath sounds: Normal breath sounds. No wheezing.  Chest:     Chest wall: No mass.  Breasts:    Tanner Score is 5.     Right: Normal. No mass or tenderness.     Left: Normal. No mass or tenderness.  Abdominal:     General: Abdomen is flat. Bowel sounds are normal. There is no distension.     Palpations:  Abdomen is soft. There is no mass.     Tenderness: There is no abdominal tenderness.  Genitourinary:    Comments: Being followed by GYN Musculoskeletal:        General: No swelling or tenderness. Normal range of motion.     Cervical back: Full passive range of motion without pain, normal range of motion and neck supple.     Right lower leg: No edema.     Left lower leg: No edema.  Lymphadenopathy:     Upper  Body:     Right upper body: No supraclavicular, axillary or pectoral adenopathy.     Left upper body: No supraclavicular, axillary or pectoral adenopathy.  Skin:    General: Skin is warm and dry.     Capillary Refill: Capillary refill takes less than 2 seconds.     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     Mental Status: She is alert and oriented to person, place, and time.     Cranial Nerves: No cranial nerve deficit.     Sensory: No sensory deficit.     Motor: No weakness.  Psychiatric:        Mood and Affect: Mood normal.        Behavior: Behavior normal.        Thought Content: Thought content normal.        Judgment: Judgment normal.         Assessment And Plan:     1. Annual physical exam Behavior modifications discussed and diet history reviewed.   Pt will continue to exercise regularly and modify diet with low GI, plant based foods and decrease intake of processed foods.  Recommend intake of daily multivitamin, Vitamin D, and calcium.  Recommend mammogram and colonoscopy for preventive screenings, as well as recommend immunizations that include influenza, TDAP, and Shingles  2. Encounter for administration of COVID-19 vaccine Covid 19 vaccine given in office observed for 15 minutes without any adverse reaction - Pfizer Fall 2023 Covid-19 Vaccine 62yrs and older  3. 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 Advised to aim for at least 150 minutes of exercise per week. - Hemoglobin A1c  4. Hypothyroidism, unspecified type Comments: Continue current medications, will check thyroid levels. - TSH + free T4  5. B12 deficiency Will check levels.  - Vitamin B12  6. Multiple sclerosis ([place]) Comments: Continue f/u with Neurology - CMP14+EGFR  7. Elevated LDL cholesterol level Comments: Cholesterol levels are stable, diet controlled, continue low fat diet - CMP14+EGFR - Lipid panel  8. Other long term (current) drug therapy - CMP14+EGFR - CBC   Patient was given opportunity to ask questions. Patient verbalized understanding of the plan and was able to repeat key elements of the plan. All questions were answered to their satisfaction.   [name], FNP   I, [name], FNP, have reviewed all documentation for this visit. The documentation on 06/09/22 for the exam, diagnosis, procedures, and orders are all accurate and complete.   THE PATIENT IS ENCOURAGED TO PRACTICE SOCIAL DISTANCING DUE TO THE COVID-19 PANDEMIC.

## 2022-06-10 ENCOUNTER — Telehealth: Payer: Self-pay | Admitting: *Deleted

## 2022-06-10 LAB — LIPID PANEL
Chol/HDL Ratio: 3.6 ratio (ref 0.0–4.4)
Cholesterol, Total: 217 mg/dL — ABNORMAL HIGH (ref 100–199)
HDL: 61 mg/dL (ref >39)
LDL Chol Calc (NIH): 130 mg/dL — ABNORMAL HIGH (ref 0–99)
Triglycerides: 149 mg/dL (ref 0–149)
VLDL Cholesterol Cal: 26 mg/dL (ref 5–40)

## 2022-06-10 LAB — CMP14+EGFR
ALT: 14 IU/L (ref 0–32)
AST: 18 IU/L (ref 0–40)
Albumin/Globulin Ratio: 1.7 (ref 1.2–2.2)
Albumin: 4.2 g/dL (ref 3.9–4.9)
Alkaline Phosphatase: 114 IU/L (ref 44–121)
BUN/Creatinine Ratio: 13 (ref 9–23)
BUN: 10 mg/dL (ref 6–24)
Bilirubin Total: 0.2 mg/dL (ref 0.0–1.2)
CO2: 26 mmol/L (ref 20–29)
Calcium: 9.9 mg/dL (ref 8.7–10.2)
Chloride: 102 mmol/L (ref 96–106)
Creatinine, Ser: 0.79 mg/dL (ref 0.57–1.00)
Globulin, Total: 2.5 g/dL (ref 1.5–4.5)
Glucose: 102 mg/dL — ABNORMAL HIGH (ref 70–99)
Potassium: 4.9 mmol/L (ref 3.5–5.2)
Sodium: 142 mmol/L (ref 134–144)
Total Protein: 6.7 g/dL (ref 6.0–8.5)
eGFR: 92 mL/min/{1.73_m2} (ref >59)

## 2022-06-10 LAB — CBC
Hematocrit: 40.8 % (ref 34.0–46.6)
Hemoglobin: 13.7 g/dL (ref 11.1–15.9)
MCH: 29.6 pg (ref 26.6–33.0)
MCHC: 33.6 g/dL (ref 31.5–35.7)
MCV: 88 fL (ref 79–97)
Platelets: 323 10*3/uL (ref 150–450)
RBC: 4.63 x10E6/uL (ref 3.77–5.28)
RDW: 13.6 % (ref 11.7–15.4)
WBC: 10.7 10*3/uL (ref 3.4–10.8)

## 2022-06-10 LAB — TSH+FREE T4
Free T4: 1.48 ng/dL (ref 0.82–1.77)
TSH: 3.99 u[IU]/mL (ref 0.450–4.500)

## 2022-06-10 LAB — HEMOGLOBIN A1C
Est. average glucose Bld gHb Est-mCnc: 123 mg/dL
Hgb A1c MFr Bld: 5.9 % — ABNORMAL HIGH (ref 4.8–5.6)

## 2022-06-10 LAB — VITAMIN B12: Vitamin B-12: 1849 pg/mL — ABNORMAL HIGH (ref 232–1245)

## 2022-06-10 NOTE — Telephone Encounter (Signed)
Patient was transferred from endo unit because she wanted to cancel her colonoscopy and possible reschedule the procedure.  I have asked the reason for the cancellation, patient stated that she just had the covid vaccine and got over a cold. Overall just not feeling good. Patient would like to reschedule sometime in the end of August or September.  Will send a reminder for patient to call back then.   (Noted. Patient did not seem like she wanted to go through the procedure at all)

## 2022-06-11 ENCOUNTER — Other Ambulatory Visit: Payer: Self-pay | Admitting: Neurology

## 2022-06-11 ENCOUNTER — Encounter: Payer: 59 | Admitting: Neurology

## 2022-06-11 NOTE — Telephone Encounter (Signed)
Last seen on 04/04/2022

## 2022-06-12 ENCOUNTER — Ambulatory Visit: Admission: RE | Admit: 2022-06-12 | Payer: 59 | Source: Home / Self Care | Admitting: Gastroenterology

## 2022-06-12 ENCOUNTER — Encounter: Admission: RE | Payer: Self-pay | Source: Home / Self Care

## 2022-06-12 SURGERY — COLONOSCOPY WITH PROPOFOL
Anesthesia: General

## 2022-07-30 ENCOUNTER — Ambulatory Visit: Payer: 59 | Admitting: Neurology

## 2022-07-30 DIAGNOSIS — G5603 Carpal tunnel syndrome, bilateral upper limbs: Secondary | ICD-10-CM | POA: Diagnosis not present

## 2022-07-30 DIAGNOSIS — R200 Anesthesia of skin: Secondary | ICD-10-CM | POA: Diagnosis not present

## 2022-07-30 NOTE — Patient Instructions (Addendum)

## 2022-07-30 NOTE — Progress Notes (Addendum)
Full Name: Samantha [name] Gender: Female MRN #: [id number] Date of Birth: July 20, 1970    Visit Date: 07/30/2022 09:37 Age: 52 Years Examining Physician: Dr. Naomie Dean Referring Physician: Dr. Despina Arias Height: 5 feet 5 inch  History: Patient here for hand pain. Right is worse. No neck pain or radicular symptoms. Shakes her hands out after sleeping. Not wearing wrist splints anymore, didn't really help. Drops things, difficulty opening jars. +mcphalens at the wtrists without digit 5 involvement in symptoms that c/w what she is reporting.     Summary:  Nerve Conduction Studies were performed on the bilateral upper extremities.  The right median APB motor nerve showed prolonged distal onset latency (5.2 ms, N<4.4)  The right Median 2nd Digit orthodromic sensory nerve showed prolonged distal peak latency (4.6 ms, N<3.4) and reduced amplitude(5 uV, N>10) The left  Median 2nd Digit orthodromic sensory nerve showed prolonged distal peak latency (3.9ms, N<3.4)  The right median/ulnar (palm) comparison nerve showed prolonged distal peak latency (Median Palm, 3.1 ms, N<2.2) and abnormal peak latency difference (Median Palm-Ulnar Palm, 1.4 ms, N<0.4) with a relative median delay.   The left median/ulnar (palm) comparison nerve showed prolonged distal peak latency (Median Palm, 2.56ms, N<2.2) and abnormal peak latency difference (Median Palm-Ulnar Palm, 0.7 ms, N<0.4) with a relative median delay.   On EMG studies the right opponens pollicis muscle showed prolonged motor unit duration, polyphasic motor units and diminished motor unit recruitment. All remaining nerves and remaining muscles (as indicated in the following tables) were within normal limits.     Conclusion: This is an abnormal study. There is electrophysiologic evidence of bilateral moderately-severe right and mild left Carpal Tunnel Syndrome.  No suggestion of polyneuropathy or radiculopathy.    ------------------------------- Naomie Dean, M.D.  [facility] [address] [place], [place] [address] Tel: [phone] Fax: [phone]  Verbal informed consent was obtained from the patient, patient was informed of potential risk of procedure, including bruising, bleeding, hematoma formation, infection, muscle weakness, muscle pain, numbness, among others.        MNC    Nerve / Sites Muscle Latency Ref. Amplitude Ref. Rel Amp Segments Distance Velocity Ref. Area    ms ms mV mV %  cm m/s m/s mVms  R Median - APB     Wrist APB 5.2 ?4.4 7.2 ?4.0 100 Wrist - APB 7   38.0     Upper arm APB 9.2  6.8  93.8 Upper arm - Wrist 25 63 ?49 39.3  L Median - APB     Wrist APB 4.2 ?4.4 9.2 ?4.0 100 Wrist - APB 7   36.4     Upper arm APB 8.7  9.4  102 Upper arm - Wrist 26 58 ?49 38.5  R Ulnar - ADM     Wrist ADM 3.0 ?3.3 7.0 ?6.0 100 Wrist - ADM 7   21.4     B.Elbow ADM 4.4  7.6  108 B.Elbow - Wrist 11 81 ?49 25.8     A.Elbow ADM 6.6  7.8  102 A.Elbow - B.Elbow 17 77 ?49 26.1  L Ulnar - ADM     Wrist ADM 2.8 ?3.3 6.7 ?6.0 100 Wrist - ADM 7   20.2     B.Elbow ADM 4.9  5.4  80.7 B.Elbow - Wrist 14 65 ?49 18.3     A.Elbow ADM 7.4  5.4  100 A.Elbow - B.Elbow 15 60 ?49 18.8  L Ulnar -  FDI     Wrist FDI 4.3 ?4.5 11.6 ?7.0 100 Wrist - FDI 8   28.8     B.Elbow FDI 6.6  10.6  91.2 B.Elbow - Wrist 13 56 ?49 27.0     A.Elbow FDI 9.3  9.9  93 A.Elbow - B.Elbow 15 56 ?49 24.9         A.Elbow - Wrist      R Ulnar - FDI     Wrist FDI 4.1 ?4.5 13.1 ?7.0 100 Wrist - FDI 8   26.5     B.Elbow FDI 5.6  11.8  90.1 B.Elbow - Wrist 10 65 ?49 23.8     A.Elbow FDI 8.3  10.3  87.2 A.Elbow - B.Elbow 16 60 ?49 19.2         A.Elbow - Wrist                     SNC    Nerve / Sites Rec. Site Peak Lat Ref.  Amp Ref. Segments Distance Peak Diff Ref.    ms ms V V  cm ms ms  R Radial - Anatomical snuff box (Forearm)     Forearm Wrist 2.4 ?2.9 18 ?15 Forearm - Wrist 10    L Radial -  Anatomical snuff box (Forearm)     Forearm Wrist 2.5 ?2.9 34 ?15 Forearm - Wrist 10    R Median, Ulnar - Transcarpal comparison     Median Palm Wrist 3.1 ?2.2 16 ?35 Median Palm - Wrist 8       Ulnar Palm Wrist 1.8 ?2.2 17 ?12 Ulnar Palm - Wrist 8          Median Palm - Ulnar Palm  1.4 ?0.4  L Median, Ulnar - Transcarpal comparison     Median Palm Wrist 2.6 ?2.2 44 ?35 Median Palm - Wrist 8       Ulnar Palm Wrist 1.8 ?2.2 6 ?12 Ulnar Palm - Wrist 8          Median Palm - Ulnar Palm  0.7 ?0.4  R Median - Orthodromic (Dig II, Mid palm)     Dig II Wrist 4.6 ?3.4 5 ?10 Dig II - Wrist 13    L Median - Orthodromic (Dig II, Mid palm)     Dig II Wrist 3.9 ?3.4 10 ?10 Dig II - Wrist 13    R Ulnar - Orthodromic, (Dig V, Mid palm)     Dig V Wrist 3.0 ?3.1 5 ?5 Dig V - Wrist 11    L Ulnar - Orthodromic, (Dig V, Mid palm)     Dig V Wrist 3.0 ?3.1 5 ?5 Dig V - Wrist 69                       F  Wave    Nerve F Lat Ref.   ms ms  R Ulnar - ADM 25.4 ?32.0  L Ulnar - ADM 28.1 ?32.0         EMG Summary Table    Spontaneous MUAP Recruitment  Muscle IA Fib PSW Fasc Other Amp Dur. Poly Pattern  R. Cervical paraspinals (low) Normal None None None _______ Normal Normal Normal Normal  R. Deltoid Normal None None None _______ Normal Normal Normal Normal  L. Cervical paraspinals (low) Normal None None None _______ Normal Normal Normal Normal  L. Deltoid Normal None None None _______ Normal Normal Normal Normal  L. Triceps brachii Normal None None None  _______ Normal Normal Normal Normal  R. Triceps brachii Normal None None None _______ Normal Normal Normal Normal  L. Flexor digitorum profundus (Ulnar) Normal None None None _______ Normal Normal Normal Normal  R. Flexor digitorum profundus (Ulnar) Normal None None None _______ Normal Normal Normal Normal  L. Pronator teres Normal None None None _______ Normal Normal Normal Normal  R. Pronator teres Normal None None None _______ Normal Normal Normal Normal   L. First dorsal interosseous Normal None None None _______ Normal Normal Normal Normal  R. First dorsal interosseous Normal None None None _______ Normal Normal Normal Normal  L. Opponens pollicis Normal None None None _______ Normal Normal Normal Normal  R. Opponens pollicis Normal None None None _______ Normal Increased 2+ Reduced

## 2022-08-02 DIAGNOSIS — G5603 Carpal tunnel syndrome, bilateral upper limbs: Secondary | ICD-10-CM | POA: Insufficient documentation

## 2022-08-02 NOTE — Progress Notes (Signed)
Patient here for emg/ncs. She has CTS. Explained as below,  reviewed these diagrams with her, pathophys, symptoms, conservative and other measured: she would like to be referred to hand center. Referral placed for CTS as discussed with patient.   Discussed: Conclusion: This is an abnormal study. There is electrophysiologic evidence of bilateral moderately-severe right and mild left Carpal Tunnel Syndrome.  No suggestion of polyneuropathy or radiculopathy.    Orders Placed This Encounter  Procedures   Ambulatory referral to Hand Surgery    I spent over 10 minutes of face-to-face and non-face-to-face time with patient on the  1. Bilateral carpal tunnel syndrome   2. Numbness    diagnosis.  This included previsit chart review, lab review, study review, order entry, electronic health record documentation, patient education on the different diagnostic and therapeutic options, counseling and coordination of care, risks and benefits of management, compliance, or risk factor reduction. This does not include time spent on emg/ncs.        Carpal Tunnel Syndrome       Carpal tunnel syndrome is a condition that causes pain, numbness, and weakness in your hand and fingers. The carpal tunnel is a narrow area located on the palm side of your wrist. Repeated wrist motion or certain diseases may cause swelling within the tunnel. This swelling pinches the main nerve in the wrist. The main nerve in the wrist is called the median nerve. What are the causes? This condition may be caused by: Repeated and forceful wrist and hand motions. Wrist injuries. Arthritis. A cyst or tumor in the carpal tunnel. Fluid buildup during pregnancy. Use of tools that vibrate. Sometimes the cause of this condition is not known. What increases the risk? The following factors may make you more likely to develop this condition: Having a job that requires you to repeatedly or forcefully move your wrist or hand or requires  you to use tools that vibrate. This may include jobs that involve using computers, working on an [employer], or working with power tools such as Radiographer, therapeutic. Being a woman. Having certain conditions, such as: Diabetes. Obesity. An underactive thyroid (hypothyroidism). Kidney failure. Rheumatoid arthritis. What are the signs or symptoms? Symptoms of this condition include: A tingling feeling in your fingers, especially in your thumb, index, and middle fingers. Tingling or numbness in your hand. An aching feeling in your entire arm, especially when your wrist and elbow are bent for a long time. Wrist pain that goes up your arm to your shoulder. Pain that goes down into your palm or fingers. A weak feeling in your hands. You may have trouble grabbing and holding items. Your symptoms may feel worse during the night. How is this diagnosed? This condition is diagnosed with a medical history and physical exam. You may also have tests, including: Electromyogram (EMG). This test measures electrical signals sent by your nerves into the muscles. Nerve conduction study. This test measures how well electrical signals pass through your nerves. Imaging tests, such as X-rays, ultrasound, and MRI. These tests check for possible causes of your condition. How is this treated? This condition may be treated with: Lifestyle changes. It is important to stop or change the activity that caused your condition. Doing exercise and activities to strengthen and stretch your muscles and tendons (physical therapy). Making lifestyle changes to help with your condition and learning how to do your daily activities safely (occupational therapy). Medicines for pain and inflammation. This may include medicine that is injected into your  wrist. A wrist splint or brace. Surgery. Follow these instructions at home: If you have a splint or brace: Wear the splint or brace as told by your health care provider. Remove it  only as told by your health care provider. Loosen the splint or brace if your fingers tingle, become numb, or turn cold and blue. Keep the splint or brace clean. If the splint or brace is not waterproof: Do not let it get wet. Cover it with a watertight covering when you take a bath or shower. Managing pain, stiffness, and swelling If directed, put ice on the painful area. To do this: If you have a removeable splint or brace, remove it as told by your health care provider. Put ice in a plastic bag. Place a towel between your skin and the bag or between the splint or brace and the bag. Leave the ice on for 20 minutes, 2-3 times a day. Do not fall asleep with the cold pack on your skin. Remove the ice if your skin turns bright red. This is very important. If you cannot feel pain, heat, or cold, you have a greater risk of damage to the area. Move your fingers often to reduce stiffness and swelling. General instructions Take over-the-counter and prescription medicines only as told by your health care provider. Rest your wrist and hand from any activity that may be causing your pain. If your condition is work related, talk with your employer about changes that can be made, such as getting a wrist pad to use while typing. Do any exercises as told by your health care provider, physical therapist, or occupational therapist. Keep all follow-up visits. This is important. Contact a health care provider if: You have new symptoms. Your pain is not controlled with medicines. Your symptoms get worse. Get help right away if: You have severe numbness or tingling in your wrist or hand. Summary Carpal tunnel syndrome is a condition that causes pain, numbness, and weakness in your hand and fingers. It is usually caused by repeated wrist motions. Lifestyle changes and medicines are used to treat carpal tunnel syndrome. Surgery may be recommended. Follow your health care provider's instructions about wearing  a splint, resting from activity, keeping follow-up visits, and calling for help. This information is not intended to replace advice given to you by your health care provider. Make sure you discuss any questions you have with your health care provider.

## 2022-08-02 NOTE — Procedures (Signed)
      Full Name: Samantha [name] Gender: Female MRN #: [id number] Date of Birth: 02/16/1971    Visit Date: 07/30/2022 09:37 Age: 52 Years Examining Physician: Dr. Dotsie Gillette Referring Physician: Dr. Richard Sater Height: 5 feet 5 inch  History: Patient here for hand pain. Right is worse. No neck pain or radicular symptoms. Shakes her hands out after sleeping. Not wearing wrist splints anymore, didn't really help. Drops things, difficulty opening jars. +mcphalens at the wtrists without digit 5 involvement in symptoms that c/w what she is reporting.     Summary:  Nerve Conduction Studies were performed on the bilateral upper extremities.  The right median APB motor nerve showed prolonged distal onset latency (5.2 ms, N<4.4)  The right Median 2nd Digit orthodromic sensory nerve showed prolonged distal peak latency (4.6 ms, N<3.4) and reduced amplitude(5 uV, N>10) The left  Median 2nd Digit orthodromic sensory nerve showed prolonged distal peak latency (3.9ms, N<3.4)  The right median/ulnar (palm) comparison nerve showed prolonged distal peak latency (Median Palm, 3.1 ms, N<2.2) and abnormal peak latency difference (Median Palm-Ulnar Palm, 1.4 ms, N<0.4) with a relative median delay.   The left median/ulnar (palm) comparison nerve showed prolonged distal peak latency (Median Palm, 2.6ms, N<2.2) and abnormal peak latency difference (Median Palm-Ulnar Palm, 0.7 ms, N<0.4) with a relative median delay.   On EMG studies the right opponens pollicis muscle showed prolonged motor unit duration, polyphasic motor units and diminished motor unit recruitment. All remaining nerves and remaining muscles (as indicated in the following tables) were within normal limits.     Conclusion: This is an abnormal study. There is electrophysiologic evidence of bilateral moderately-severe right and mild left Carpal Tunnel Syndrome.  No suggestion of polyneuropathy or radiculopathy.    ------------------------------- Crew Goren, M.D.  [facility] [address], [address] [place], [place] [address] Tel: [phone] Fax: [phone]  Verbal informed consent was obtained from the patient, patient was informed of potential risk of procedure, including bruising, bleeding, hematoma formation, infection, muscle weakness, muscle pain, numbness, among others.        MNC    Nerve / Sites Muscle Latency Ref. Amplitude Ref. Rel Amp Segments Distance Velocity Ref. Area    ms ms mV mV %  cm m/s m/s mVms  R Median - APB     Wrist APB 5.2 ?4.4 7.2 ?4.0 100 Wrist - APB 7   38.0     Upper arm APB 9.2  6.8  93.8 Upper arm - Wrist 25 63 ?49 39.3  L Median - APB     Wrist APB 4.2 ?4.4 9.2 ?4.0 100 Wrist - APB 7   36.4     Upper arm APB 8.7  9.4  102 Upper arm - Wrist 26 58 ?49 38.5  R Ulnar - ADM     Wrist ADM 3.0 ?3.3 7.0 ?6.0 100 Wrist - ADM 7   21.4     B.Elbow ADM 4.4  7.6  108 B.Elbow - Wrist 11 81 ?49 25.8     A.Elbow ADM 6.6  7.8  102 A.Elbow - B.Elbow 17 77 ?49 26.1  L Ulnar - ADM     Wrist ADM 2.8 ?3.3 6.7 ?6.0 100 Wrist - ADM 7   20.2     B.Elbow ADM 4.9  5.4  80.7 B.Elbow - Wrist 14 65 ?49 18.3     A.Elbow ADM 7.4  5.4  100 A.Elbow - B.Elbow 15 60 ?49 18.8  L Ulnar -   FDI     Wrist FDI 4.3 ?4.5 11.6 ?7.0 100 Wrist - FDI 8   28.8     B.Elbow FDI 6.6  10.6  91.2 B.Elbow - Wrist 13 56 ?49 27.0     A.Elbow FDI 9.3  9.9  93 A.Elbow - B.Elbow 15 56 ?49 24.9         A.Elbow - Wrist      R Ulnar - FDI     Wrist FDI 4.1 ?4.5 13.1 ?7.0 100 Wrist - FDI 8   26.5     B.Elbow FDI 5.6  11.8  90.1 B.Elbow - Wrist 10 65 ?49 23.8     A.Elbow FDI 8.3  10.3  87.2 A.Elbow - B.Elbow 16 60 ?49 19.2         A.Elbow - Wrist                     SNC    Nerve / Sites Rec. Site Peak Lat Ref.  Amp Ref. Segments Distance Peak Diff Ref.    ms ms V V  cm ms ms  R Radial - Anatomical snuff box (Forearm)     Forearm Wrist 2.4 ?2.9 18 ?15 Forearm - Wrist 10    L Radial -  Anatomical snuff box (Forearm)     Forearm Wrist 2.5 ?2.9 34 ?15 Forearm - Wrist 10    R Median, Ulnar - Transcarpal comparison     Median Palm Wrist 3.1 ?2.2 16 ?35 Median Palm - Wrist 8       Ulnar Palm Wrist 1.8 ?2.2 17 ?12 Ulnar Palm - Wrist 8          Median Palm - Ulnar Palm  1.4 ?0.4  L Median, Ulnar - Transcarpal comparison     Median Palm Wrist 2.6 ?2.2 44 ?35 Median Palm - Wrist 8       Ulnar Palm Wrist 1.8 ?2.2 6 ?12 Ulnar Palm - Wrist 8          Median Palm - Ulnar Palm  0.7 ?0.4  R Median - Orthodromic (Dig II, Mid palm)     Dig II Wrist 4.6 ?3.4 5 ?10 Dig II - Wrist 13    L Median - Orthodromic (Dig II, Mid palm)     Dig II Wrist 3.9 ?3.4 10 ?10 Dig II - Wrist 13    R Ulnar - Orthodromic, (Dig V, Mid palm)     Dig V Wrist 3.0 ?3.1 5 ?5 Dig V - Wrist 11    L Ulnar - Orthodromic, (Dig V, Mid palm)     Dig V Wrist 3.0 ?3.1 5 ?5 Dig V - Wrist 11                       F  Wave    Nerve F Lat Ref.   ms ms  R Ulnar - ADM 25.4 ?32.0  L Ulnar - ADM 28.1 ?32.0         EMG Summary Table    Spontaneous MUAP Recruitment  Muscle IA Fib PSW Fasc Other Amp Dur. Poly Pattern  R. Cervical paraspinals (low) Normal None None None _______ Normal Normal Normal Normal  R. Deltoid Normal None None None _______ Normal Normal Normal Normal  L. Cervical paraspinals (low) Normal None None None _______ Normal Normal Normal Normal  L. Deltoid Normal None None None _______ Normal Normal Normal Normal  L. Triceps brachii Normal None None None   _______ Normal Normal Normal Normal  R. Triceps brachii Normal None None None _______ Normal Normal Normal Normal  L. Flexor digitorum profundus (Ulnar) Normal None None None _______ Normal Normal Normal Normal  R. Flexor digitorum profundus (Ulnar) Normal None None None _______ Normal Normal Normal Normal  L. Pronator teres Normal None None None _______ Normal Normal Normal Normal  R. Pronator teres Normal None None None _______ Normal Normal Normal Normal   L. First dorsal interosseous Normal None None None _______ Normal Normal Normal Normal  R. First dorsal interosseous Normal None None None _______ Normal Normal Normal Normal  L. Opponens pollicis Normal None None None _______ Normal Normal Normal Normal  R. Opponens pollicis Normal None None None _______ Normal Increased 2+ Reduced     

## 2022-08-03 ENCOUNTER — Telehealth: Payer: Self-pay | Admitting: Neurology

## 2022-08-03 NOTE — Telephone Encounter (Signed)
Referral sent to the [facility], phone # [phone]. 

## 2022-08-06 ENCOUNTER — Encounter: Payer: Self-pay | Admitting: Neurology

## 2022-08-20 ENCOUNTER — Other Ambulatory Visit: Payer: Self-pay | Admitting: Neurology

## 2022-08-20 NOTE — Telephone Encounter (Signed)
Last seen on 04/07/22  Follow up scheduled on 10/06/22

## 2022-09-22 ENCOUNTER — Other Ambulatory Visit: Payer: Self-pay | Admitting: Neurology

## 2022-10-06 ENCOUNTER — Ambulatory Visit: Payer: 59 | Admitting: Neurology

## 2022-10-06 ENCOUNTER — Encounter: Payer: Self-pay | Admitting: Neurology

## 2022-10-06 VITALS — BP 124/86 | HR 91 | Ht 65.0 in | Wt 262.0 lb

## 2022-10-06 DIAGNOSIS — E559 Vitamin D deficiency, unspecified: Secondary | ICD-10-CM

## 2022-10-06 DIAGNOSIS — G35 Multiple sclerosis: Secondary | ICD-10-CM | POA: Diagnosis not present

## 2022-10-06 DIAGNOSIS — G5603 Carpal tunnel syndrome, bilateral upper limbs: Secondary | ICD-10-CM | POA: Diagnosis not present

## 2022-10-06 DIAGNOSIS — Z79899 Other long term (current) drug therapy: Secondary | ICD-10-CM

## 2022-10-06 MED ORDER — VITAMIN D (ERGOCALCIFEROL) 1.25 MG (50000 UNIT) PO CAPS: 50000.0000 [IU] | ORAL_CAPSULE | ORAL | 1 refills | Status: DC

## 2022-10-06 MED ORDER — MODAFINIL 200 MG PO TABS: ORAL_TABLET | ORAL | 5 refills | Status: DC

## 2022-10-06 MED ORDER — CYCLOBENZAPRINE HCL 5 MG PO TABS: 5.0000 mg | ORAL_TABLET | Freq: Three times a day (TID) | ORAL | 11 refills | Status: DC | PRN

## 2022-10-06 NOTE — Progress Notes (Addendum)
Except 4+/5 APB muscles  [facility]  PATIENT: Samantha [name] DOB: [birth year]  REFERRING DOCTOR OR PCP: Priscille Kluver, PA-C SOURCE: Patient, notes from [facility] (Dr. Elwyn Reach), imaging and laboratory reports, MRI images ere personally reviewed  _________________________________   HISTORICAL  CHIEF COMPLAINT:  Chief Complaint  Patient presents with   Follow-up    Pt in room #10 and alone. Pt here today to f/u on her MS.    HISTORY OF PRESENT ILLNESS:  Samantha [name] is a 52 y.o. woman with relapsing remitting multiple sclerosis diagnosed in 2012  Update 09/16/2022 She is currently on Ocrevus.  Next infusion is 11/05/2022.   She is tolerating it well.  When checked December 2023, IgG/IgM was normal.  CD19/20 were 3.4.   She feels some wearing off the last month of each cycle  Gait is doing ok though balance is reduced and she needs rails on the stairs.    She feels strength is symmetric.  No foot drop.  She has some stumbles..     Mild leg dyseshesias.  She has some hand numbness felt to e due to CTS.    She has a T2 hyperintense focus at C2.  She notes more stiffness and pain in legs if she sits a long time.   This is also painful when she stands up.  She will get spasms in her legs when she sleeps that will sometimes awaken help.   Tizanidine has not helped much and she has gained some weight.  Baclofen, even 60 mg/day was not helpful.  Nortriptyline may help dysesthesias some  She has urinary urgency only helped a bit by oxybutynin so she stopped  Vision is ok - needs to use reading glasses more.     She has more stress (in midld[place] of divorcee).  Her grandmother died.   She has had car issues.  .     No depression.   She works third shift (11 pm to 7am) and sleeps poorly     Cognition is usually ok but has some word finding difficulties.    She is doing water aerobic and enjoys it.    She had a NCV/EMG showing CTS.   She had a steroid shot  without benefit so is planning to do surgery in September/[place].  She takes meloxicam and uses a brace.    MS history:  She was diagnosed with MS around 2012.   She woke up with numbness in her left face and tongue.    She had an MRI of the brain and was diagnosed with multiple sclerosis.   She was diagnosed in [place], [place].  She received IV steroids and numbness improved over time.    She was placed on Tecfidera x a couple years but had a relapse and then was placed on Plegridy.   She had difficulty with injections so switched to Ocrevus in 2019.   Her MS has done well and she has not had any exacerbations.     She is currently getting infusions at [facility] in [facility], [place].     She has been seeing Dr. Elwyn Reach the past 3 years at [facility] but insurance company is making her switch.   IMAGING: Personally reviewed today MRI of the brain 08/12/2020 shows multiple T2/Flair hyperintense foci in the periventrixular, juxtacotical and deep white matter and 2 foci in cerebellar hemisphere.   No enhancement    No change (by report) compared to 2020.  MRI of  the cervical spine 03/17/2017 shows a T2 hyperintense focus at C2.   It was otherwise normal.  No enhancing lesions.  LABS: IgG was normal.  IgM borderline (57 at [facility]), Vit D was normal.  QuantiFERON TB and hepatitis chronic infection labs were normal in 2017.  REVIEW OF SYSTEMS: Constitutional: No fevers, chills, sweats, or change in appetite Eyes: No visual changes, double vision, eye pain Ear, nose and throat: No hearing loss, ear pain, nasal congestion, sore throat Cardiovascular: No chest pain, palpitations Respiratory:  No shortness of breath at rest or with exertion.   No wheezes GastrointestinaI: No nausea, vomiting, diarrhea, abdominal pain, fecal incontinence Genitourinary:  No dysuria, urinary retention or frequency.  No nocturia. Musculoskeletal:  No neck pain, back pain Integumentary: No rash, pruritus, skin lesions Neurological: as  above Psychiatric: No depression at this time.  No anxiety Endocrine: No palpitations, diaphoresis, change in appetite, change in weigh or increased thirst Hematologic/Lymphatic:  No anemia, purpura, petechiae. Allergic/Immunologic: No itchy/runny eyes, nasal congestion, recent allergic reactions, rashes  ALLERGIES: No Known Allergies  HOME MEDICATIONS:  Current Outpatient Medications:    b complex vitamins capsule, Take 1 capsule by mouth daily., Disp: , Rfl:    baclofen (LIORESAL) 10 MG tablet, Take 1 tablet (10 mg total) by mouth 3 (three) times daily., Disp: 90 each, Rfl: 5   calcium carbonate (OSCAL) 1500 (600 Ca) MG TABS tablet, Take by mouth 2 (two) times daily with a meal., Disp: , Rfl:    Cholecalciferol 125 MCG (5000 UT) TABS, Take by mouth., Disp: , Rfl:    Cranberry 300 MG tablet, Take by mouth., Disp: , Rfl:    Cranberry 500 MG TABS, Take by mouth., Disp: , Rfl:    etodolac (LODINE) 400 MG tablet, TAKE 1 TABLET BY MOUTH TWICE A DAY, Disp: 60 tablet, Rfl: 5   ibuprofen (ADVIL) 400 MG tablet, Take by mouth., Disp: , Rfl:    levothyroxine (SYNTHROID) 175 MCG tablet, TAKE 1 TABLET BY MOUTH DAILY BEFORE BREAKFAST., Disp: 30 tablet, Rfl: 5   metroNIDAZOLE (FLAGYL) 500 MG tablet, Take 2 tabs BID for 1 day, Disp: 4 tablet, Rfl: 0   Multiple Vitamin (MULTIVITAMIN) capsule, Take by mouth., Disp: , Rfl:    nitrofurantoin, macrocrystal-monohydrate, (MACROBID) 100 MG capsule, Take 1 capsule (100 mg total) by mouth 2 (two) times daily., Disp: 10 capsule, Rfl: 0   nortriptyline (PAMELOR) 25 MG capsule, TAKE 1 CAPSULE BY MOUTH EVERYDAY AT BEDTIME, Disp: 90 capsule, Rfl: 2   Ocrelizumab (OCREVUS IV), Inject into the vein. Every 6 mths, Disp: , Rfl:    oxybutynin (DITROPAN-XL) 10 MG 24 hr tablet, TAKE ONE TABLET BY MOUTH AT BEDTIME, Disp: 90 tablet, Rfl: 3   pregabalin (LYRICA) 75 MG capsule, Take 1 capsule (75 mg total) by mouth 2 (two) times daily., Disp: 60 capsule, Rfl: 5   tiZANidine  (ZANAFLEX) 4 MG tablet, 1/2 to 1 po tid, Disp: 90 tablet, Rfl: 11  PAST MEDICAL HISTORY: Past Medical History:  Diagnosis Date   H/O shoulder surgery    Hypothyroid    Multiple sclerosis (HCC)    Obese     PAST SURGICAL HISTORY: Past Surgical History:  Procedure Laterality Date   SHOULDER ARTHROSCOPY Left    THYROIDECTOMY     TONSILLECTOMY      FAMILY HISTORY: Family History  Problem Relation Age of Onset   Ovarian cancer Mother    Breast cancer Mother    Cervical cancer Mother     SOCIAL  HISTORY:  Social History   Socioeconomic History   Marital status: Married    Spouse name: Not on file   Number of children: Not on file   Years of education: Not on file   Highest education level: Not on file  Occupational History   Not on file  Tobacco Use   Smoking status: Never   Smokeless tobacco: Never  Vaping Use   Vaping Use: Never used  Substance and Sexual Activity   Alcohol use: Yes    Comment: social   Drug use: No   Sexual activity: Yes    Birth control/protection: I.U.D.    Comment: Mirena  Other Topics Concern   Not on file  Social History Narrative   Not on file   Social Determinants of Health   Financial Resource Strain: Not on file  Food Insecurity: Not on file  Transportation Needs: Not on file  Physical Activity: Not on file  Stress: Not on file  Social Connections: Not on file  Intimate Partner Violence: Not on file     PHYSICAL EXAM  Vitals:   04/07/22 1256  BP: (!) 149/98  Pulse: 96  Weight: 269 lb (122 kg)  Height: 5\' 5"  (1.651 m)    Body mass index is 44.76 kg/m.   General: The patient is well-developed and well-nourished and in no acute distress  HEENT:  Head is [place]/AT.  Sclera are anicteric.     Neck: Good range of motion.  The neck is nontender.   Skin: Extremities are without rash or  edema.  Musculoskeletal:  Back is nontender  Neurologic Exam  Mental status: The patient is alert and oriented x 3 at the time of  the examination. The patient has apparent normal recent and remote memory, with an apparently normal attention span and concentration ability.   Speech is normal.  Cranial nerves: Extraocular movements are full.  Facial strength and sensation was normal.  No dysarthria.. No obvious hearing deficits are noted.  Motor:  Muscle bulk is normal.   Tone is slightly increased in legs.. Strength is  5 / 5 in all 4 extremities except 4+/5 APB muscles).   Sensory: Sensory testing is intact to pinprick, soft touch and vibration sensation in all 4 extremities except slight reduced sensation in the right median distribution  Coordination: Cerebellar testing reveals good finger-nose-finger and heel-to-shin bilaterally.  Gait and station: Station is normal.   Gait is spastic and mildly wide. Tandem gait is wide.  . Romberg is negative.   Reflexes: Deep tendon reflexes are symmetric and normal bilaterally.       Lab Results  Component Value Date   TSH 1.320 12/11/2021       ASSESSMENT AND PLAN  Multiple sclerosis (HCC) - Plan: IgG, IgA, IgM, CBC with Differential/Platelet, CD20 B Cells  Bilateral carpal tunnel syndrome - Plan: NCV with EMG(electromyography)  Numbness - Plan: NCV with EMG(electromyography)  High risk medication use - Plan: IgG, IgA, IgM, CBC with Differential/Platelet, CD20 B Cells  Muscle spasticity  1.  She will continue Ocrevus.  We will check some lab work today (CD20 count a little higher than optimal)  Around end of year will check MRi 2.   Change tizanidine to Flexeril for leg spasticity.     3.    Stay active and exercise regularly 4.    Return in 6 months or sooner if there are new or worsening neurologic symptoms.  Fitzhugh Vizcarrondo A. Epimenio Foot, MD, [facility] 04/07/2022, 4:38 PM Certified in  Neurology, Clinical Neurophysiology, Sleep Medicine and Neuroimaging  [facility] [address] [place], [place] [address] [phone]

## 2022-10-07 LAB — IGG, IGA, IGM: IgA/Immunoglobulin A, Serum: 136 mg/dL (ref 87–352)

## 2022-10-07 LAB — CD20 B CELLS

## 2022-10-09 LAB — IGG, IGA, IGM
IgG (Immunoglobin G), Serum: 797 mg/dL (ref 586–1602)
IgM (Immunoglobulin M), Srm: 35 mg/dL (ref 26–217)

## 2022-10-09 LAB — CD20 B CELLS: % CD19-B Cells: 0.5 % — ABNORMAL LOW (ref 4.6–22.1)

## 2022-11-25 ENCOUNTER — Other Ambulatory Visit: Payer: Self-pay | Admitting: Nurse Practitioner

## 2022-11-25 DIAGNOSIS — E039 Hypothyroidism, unspecified: Secondary | ICD-10-CM

## 2022-12-07 NOTE — Progress Notes (Signed)
Madelaine Bhat, CMA,acting as a Neurosurgeon for [name], FNP.,have documented all relevant documentation on the behalf of [name], FNP,as directed by  [name], FNP while in the presence of [name], FNP.  Subjective:  Patient ID: Samantha [name] , female    DOB: 07-17-1970 , 52 y.o.   MRN: [id number]  Chief Complaint  Patient presents with   Hypothyroidism    HPI  Patient presents today a tsh and chol follow up, patient reports compliance with medications. Patient denies any chest pain, SOB, and headaches. Patient has no concerns today. She is awaiting carpal tunnel surgery to her right hand.   BP Readings from Last 3 Encounters: 12/08/22 : 120/84 10/06/22 : 124/86 07/30/22 : 139/87       Past Medical History:  Diagnosis Date   H/O shoulder surgery    Hypothyroid    Multiple sclerosis (HCC)    Obese    Recurrent UTI 07/11/2013   Scaly skin 07/22/2015   Urinary incontinence in female 05/05/2016     Family History  Problem Relation Age of Onset   Ovarian cancer Mother    Breast cancer Mother    Cervical cancer Mother      Current Outpatient Medications:    b complex vitamins capsule, Take 1 capsule by mouth daily., Disp: , Rfl:    calcium carbonate (OSCAL) 1500 (600 Ca) MG TABS tablet, Take by mouth 2 (two) times daily with a meal., Disp: , Rfl:    Cholecalciferol 125 MCG (5000 UT) TABS, Take by mouth., Disp: , Rfl:    Cranberry 500 MG TABS, Take by mouth., Disp: , Rfl:    cyclobenzaprine (FLEXERIL) 5 MG tablet, Take 1 tablet (5 mg total) by mouth every 8 (eight) hours as needed for muscle spasms., Disp: 30 tablet, Rfl: 11   ibuprofen (ADVIL) 400 MG tablet, Take by mouth., Disp: , Rfl:    levothyroxine (SYNTHROID) 175 MCG tablet, TAKE 1 TABLET BY MOUTH EVERY DAY BEFORE BREAKFAST, Disp: 30 tablet, Rfl: 5   meloxicam (MOBIC) 7.5 MG tablet, Take 7.5 mg by mouth daily., Disp: , Rfl:    modafinil (PROVIGIL) 200 MG tablet, One po qd, Disp: 30 tablet, Rfl:  5   Multiple Vitamin (MULTIVITAMIN) capsule, Take by mouth., Disp: , Rfl:    nortriptyline (PAMELOR) 25 MG capsule, TAKE 1 CAPSULE BY MOUTH EVERYDAY AT BEDTIME, Disp: 30 capsule, Rfl: 8   Ocrelizumab (OCREVUS IV), Inject into the vein. Every 6 mths, Disp: , Rfl:    Vitamin D, Ergocalciferol, (DRISDOL) 1.25 MG (50000 UNIT) CAPS capsule, Take 1 capsule (50,000 Units total) by mouth every 7 (seven) days., Disp: 13 capsule, Rfl: 1   No Known Allergies   Review of Systems  Constitutional: Negative.   HENT: Negative.    Eyes: Negative.   Respiratory: Negative.    Cardiovascular: Negative.   Gastrointestinal: Negative.   Endocrine: Negative.   Genitourinary: Negative.   Musculoskeletal: Negative.   Skin: Negative.   Allergic/Immunologic: Negative.   Hematological: Negative.   Psychiatric/Behavioral: Negative.       Today's Vitals   12/08/22 1407  BP: 120/84  Pulse: 93  Temp: 98.4 F (36.9 C)  TempSrc: Oral  Weight: 260 lb (117.9 kg)  Height: 5\' 5"  (1.651 m)  PainSc: 0-No pain   Body mass index is 43.27 kg/m.  Wt Readings from Last 3 Encounters:  12/08/22 260 lb (117.9 kg)  10/06/22 262 lb (118.8 kg)  07/30/22 259 lb 12.8 oz (117.8 kg)  Objective:  Physical Exam Vitals reviewed.  Constitutional:      General: She is not in acute distress.    Appearance: Normal appearance. She is well-developed. She is obese.  HENT:     Head: Normocephalic and atraumatic.  Eyes:     Pupils: Pupils are equal, round, and reactive to light.  Cardiovascular:     Rate and Rhythm: Normal rate and regular rhythm.     Pulses: Normal pulses.     Heart sounds: Normal heart sounds. No murmur heard. Pulmonary:     Effort: Pulmonary effort is normal. No respiratory distress.     Breath sounds: Normal breath sounds. No wheezing.  Skin:    General: Skin is warm and dry.     Capillary Refill: Capillary refill takes less than 2 seconds.  Neurological:     General: No focal deficit present.      Mental Status: She is alert and oriented to person, place, and time.     Cranial Nerves: No cranial nerve deficit.     Motor: No weakness.  Psychiatric:        Mood and Affect: Mood normal.        Behavior: Behavior normal.        Thought Content: Thought content normal.        Judgment: Judgment normal.         Assessment And Plan:  Hypothyroidism, unspecified type Assessment & Plan: Stable, continue current medications. Pending labs will make medication adjustments  Orders: -     TSH + free T4  Elevated LDL cholesterol level Assessment & Plan: Cholesterol levels are slightly elevated, will check lipid panel. Encouraged to limit intake of fatty foods.   Orders: -     Lipid panel  Herpes zoster vaccination declined Assessment & Plan: Declines shingrix, educated on disease process and is aware if he changes his mind to notify office    Need for influenza vaccination Assessment & Plan: Influenza vaccine administered Encouraged to take Tylenol as needed for fever or muscle aches.   Orders: -     Flu vaccine trivalent PF, 6mos and older(Flulaval,Afluria,Fluarix,Fluzone)  B12 deficiency Assessment & Plan: Will recheck vitamin B12 levels.   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 Advised to aim for at least 150 minutes of exercise per week.      Return for 6 month thyroid check.  Patient was given opportunity to ask questions. Patient verbalized understanding of the  plan and was able to repeat key elements of the plan. All questions were answered to their satisfaction.    Jeanell Sparrow, FNP, have reviewed all documentation for this visit. The documentation on 12/08/22 for the exam, diagnosis, procedures, and orders are all accurate and complete.   IF YOU HAVE BEEN REFERRED TO A SPECIALIST, IT MAY TAKE 1-2 WEEKS TO SCHEDULE/PROCESS THE REFERRAL. IF YOU HAVE NOT HEARD FROM US/SPECIALIST IN TWO WEEKS, PLEASE GIVE [place] A CALL AT [phone].

## 2022-12-08 ENCOUNTER — Ambulatory Visit: Payer: 59 | Admitting: Nurse Practitioner

## 2022-12-08 ENCOUNTER — Encounter: Payer: Self-pay | Admitting: Nurse Practitioner

## 2022-12-08 VITALS — BP 120/84 | HR 93 | Temp 98.4°F | Ht 65.0 in | Wt 260.0 lb

## 2022-12-08 DIAGNOSIS — R7309 Other abnormal glucose: Secondary | ICD-10-CM

## 2022-12-08 DIAGNOSIS — E661 Drug-induced obesity: Secondary | ICD-10-CM

## 2022-12-08 DIAGNOSIS — G35 Multiple sclerosis: Secondary | ICD-10-CM

## 2022-12-08 DIAGNOSIS — Z6841 Body Mass Index (BMI) 40.0 and over, adult: Secondary | ICD-10-CM

## 2022-12-08 DIAGNOSIS — Z23 Encounter for immunization: Secondary | ICD-10-CM

## 2022-12-08 DIAGNOSIS — E7800 Pure hypercholesterolemia, unspecified: Secondary | ICD-10-CM | POA: Diagnosis not present

## 2022-12-08 DIAGNOSIS — E039 Hypothyroidism, unspecified: Secondary | ICD-10-CM

## 2022-12-08 DIAGNOSIS — E538 Deficiency of other specified B group vitamins: Secondary | ICD-10-CM

## 2022-12-08 DIAGNOSIS — Z2821 Immunization not carried out because of patient refusal: Secondary | ICD-10-CM

## 2022-12-09 LAB — HEMOGLOBIN A1C
Est. average glucose Bld gHb Est-mCnc: 117 mg/dL
Hgb A1c MFr Bld: 5.7 % — ABNORMAL HIGH (ref 4.8–5.6)

## 2022-12-09 LAB — LIPID PANEL
Chol/HDL Ratio: 3.5 ratio (ref 0.0–4.4)
Cholesterol, Total: 208 mg/dL — ABNORMAL HIGH (ref 100–199)
HDL: 60 mg/dL (ref >39)
LDL Chol Calc (NIH): 128 mg/dL — ABNORMAL HIGH (ref 0–99)
Triglycerides: 113 mg/dL (ref 0–149)
VLDL Cholesterol Cal: 20 mg/dL (ref 5–40)

## 2022-12-09 LAB — VITAMIN B12: Vitamin B-12: 683 pg/mL (ref 232–1245)

## 2022-12-09 LAB — VITAMIN D 25 HYDROXY (VIT D DEFICIENCY, FRACTURES): Vit D, 25-Hydroxy: 71.4 ng/mL (ref 30.0–100.0)

## 2022-12-09 LAB — TSH+FREE T4
Free T4: 0.98 ng/dL (ref 0.82–1.77)
TSH: 13.7 u[IU]/mL — ABNORMAL HIGH (ref 0.450–4.500)

## 2022-12-10 ENCOUNTER — Other Ambulatory Visit: Payer: Self-pay | Admitting: Orthopedic Surgery

## 2022-12-20 ENCOUNTER — Encounter: Payer: Self-pay | Admitting: Nurse Practitioner

## 2022-12-20 DIAGNOSIS — R7309 Other abnormal glucose: Secondary | ICD-10-CM | POA: Insufficient documentation

## 2022-12-20 DIAGNOSIS — Z2821 Immunization not carried out because of patient refusal: Secondary | ICD-10-CM | POA: Insufficient documentation

## 2022-12-20 DIAGNOSIS — E661 Drug-induced obesity: Secondary | ICD-10-CM | POA: Insufficient documentation

## 2022-12-20 DIAGNOSIS — Z23 Encounter for immunization: Secondary | ICD-10-CM | POA: Insufficient documentation

## 2022-12-20 DIAGNOSIS — E538 Deficiency of other specified B group vitamins: Secondary | ICD-10-CM | POA: Insufficient documentation

## 2022-12-20 DIAGNOSIS — E7800 Pure hypercholesterolemia, unspecified: Secondary | ICD-10-CM | POA: Insufficient documentation

## 2022-12-20 DIAGNOSIS — E039 Hypothyroidism, unspecified: Secondary | ICD-10-CM | POA: Insufficient documentation

## 2022-12-20 NOTE — Assessment & Plan Note (Signed)
Stable, continue follow-up with neurology

## 2022-12-20 NOTE — Assessment & Plan Note (Signed)
Declines shingrix, educated on disease process and is aware if he changes his mind to notify office  

## 2022-12-20 NOTE — Assessment & Plan Note (Signed)
Cholesterol levels are slightly elevated, will check lipid panel. Encouraged to limit intake of fatty foods.

## 2022-12-20 NOTE — Assessment & Plan Note (Signed)
Influenza vaccine administered Encouraged to take Tylenol as needed for fever or muscle aches.

## 2022-12-20 NOTE — Assessment & Plan Note (Signed)
She is encouraged to strive for BMI less than 30 to decrease cardiac risk. Advised to aim for at least 150 minutes of exercise per week.  

## 2022-12-20 NOTE — Assessment & Plan Note (Signed)
Will recheck vitamin B12 levels.

## 2022-12-20 NOTE — Assessment & Plan Note (Signed)
Stable, continue current medications. Pending labs will make medication adjustments

## 2022-12-20 NOTE — Assessment & Plan Note (Signed)
hgbA1c is stable, continue focusing on diet low in sugar and starches

## 2023-01-04 ENCOUNTER — Encounter (HOSPITAL_BASED_OUTPATIENT_CLINIC_OR_DEPARTMENT_OTHER): Payer: Self-pay | Admitting: Orthopedic Surgery

## 2023-01-11 ENCOUNTER — Encounter (HOSPITAL_BASED_OUTPATIENT_CLINIC_OR_DEPARTMENT_OTHER)
Admission: RE | Disposition: A | Discharge status: Home / Self Care | Payer: Self-pay | Source: Home / Self Care | Attending: Orthopedic Surgery

## 2023-01-11 ENCOUNTER — Ambulatory Visit (HOSPITAL_BASED_OUTPATIENT_CLINIC_OR_DEPARTMENT_OTHER): Payer: 59 | Admitting: Anesthesiology

## 2023-01-11 ENCOUNTER — Ambulatory Visit (HOSPITAL_BASED_OUTPATIENT_CLINIC_OR_DEPARTMENT_OTHER)
Admission: RE | Admit: 2023-01-11 | Discharge: 2023-01-11 | Disposition: A | Discharge status: Home / Self Care | Payer: 59 | Attending: Orthopedic Surgery | Admitting: Orthopedic Surgery

## 2023-01-11 ENCOUNTER — Encounter (HOSPITAL_BASED_OUTPATIENT_CLINIC_OR_DEPARTMENT_OTHER): Payer: Self-pay | Admitting: Orthopedic Surgery

## 2023-01-11 DIAGNOSIS — G5601 Carpal tunnel syndrome, right upper limb: Secondary | ICD-10-CM | POA: Insufficient documentation

## 2023-01-11 DIAGNOSIS — E039 Hypothyroidism, unspecified: Secondary | ICD-10-CM | POA: Diagnosis not present

## 2023-01-11 DIAGNOSIS — Z01818 Encounter for other preprocedural examination: Secondary | ICD-10-CM

## 2023-01-11 HISTORY — PX: CARPAL TUNNEL RELEASE: SHX101

## 2023-01-11 SURGERY — CARPAL TUNNEL RELEASE
Anesthesia: Monitor Anesthesia Care | Laterality: Right

## 2023-01-11 MED ORDER — CEFAZOLIN SODIUM-DEXTROSE 2-4 GM/100ML-% IV SOLN
INTRAVENOUS | Status: AC
  Filled 2023-01-11: qty 100

## 2023-01-11 MED ORDER — 0.9 % SODIUM CHLORIDE (POUR BTL) OPTIME
TOPICAL | Status: DC | PRN
  Administered 2023-01-11: 50 mL

## 2023-01-11 MED ORDER — DEXMEDETOMIDINE HCL IN NACL 80 MCG/20ML IV SOLN
INTRAVENOUS | Status: DC | PRN
Start: 2023-01-11 — End: 2023-01-11
  Administered 2023-01-11: 8 ug via INTRAVENOUS

## 2023-01-11 MED ORDER — MIDAZOLAM HCL 5 MG/5ML IJ SOLN
INTRAMUSCULAR | Status: DC | PRN
  Administered 2023-01-11: 2 mg via INTRAVENOUS

## 2023-01-11 MED ORDER — PROPOFOL 10 MG/ML IV BOLUS
INTRAVENOUS | Status: AC
  Filled 2023-01-11: qty 20

## 2023-01-11 MED ORDER — FENTANYL CITRATE (PF) 100 MCG/2ML IJ SOLN
INTRAMUSCULAR | Status: DC | PRN
  Administered 2023-01-11 (×2): 50 ug via INTRAVENOUS

## 2023-01-11 MED ORDER — ONDANSETRON HCL 4 MG/2ML IJ SOLN
INTRAMUSCULAR | Status: DC | PRN
Start: 2023-01-11 — End: 2023-01-11
  Administered 2023-01-11: 4 mg via INTRAVENOUS

## 2023-01-11 MED ORDER — CEFAZOLIN SODIUM-DEXTROSE 2-4 GM/100ML-% IV SOLN
2.0000 g | INTRAVENOUS | Status: AC
  Administered 2023-01-11: 2 g via INTRAVENOUS

## 2023-01-11 MED ORDER — LACTATED RINGERS IV SOLN: INTRAVENOUS | Status: DC

## 2023-01-11 MED ORDER — PROPOFOL 500 MG/50ML IV EMUL
INTRAVENOUS | Status: DC | PRN
  Administered 2023-01-11: 75 ug/kg/min via INTRAVENOUS

## 2023-01-11 MED ORDER — FENTANYL CITRATE (PF) 100 MCG/2ML IJ SOLN: 25.0000 ug | INTRAMUSCULAR | Status: DC | PRN

## 2023-01-11 MED ORDER — ONDANSETRON HCL 4 MG/2ML IJ SOLN
INTRAMUSCULAR | Status: AC
  Filled 2023-01-11: qty 2

## 2023-01-11 MED ORDER — MIDAZOLAM HCL 2 MG/2ML IJ SOLN
INTRAMUSCULAR | Status: AC
  Filled 2023-01-11: qty 2

## 2023-01-11 MED ORDER — BUPIVACAINE HCL (PF) 0.25 % IJ SOLN
INTRAMUSCULAR | Status: DC | PRN
  Administered 2023-01-11: 9 mL

## 2023-01-11 MED ORDER — PROPOFOL 10 MG/ML IV BOLUS
INTRAVENOUS | Status: DC | PRN
  Administered 2023-01-11: 30 mg via INTRAVENOUS
  Administered 2023-01-11: 20 mg via INTRAVENOUS

## 2023-01-11 MED ORDER — OXYCODONE HCL 5 MG PO TABS
ORAL_TABLET | ORAL | 0 refills | Status: DC
Start: 2023-01-11 — End: 2023-06-09

## 2023-01-11 MED ORDER — FENTANYL CITRATE (PF) 100 MCG/2ML IJ SOLN
INTRAMUSCULAR | Status: AC
  Filled 2023-01-11: qty 2

## 2023-01-11 MED ORDER — ACETAMINOPHEN 10 MG/ML IV SOLN: 1000.0000 mg | Freq: Once | INTRAVENOUS | Status: DC | PRN

## 2023-01-11 SURGICAL SUPPLY — 35 items
APL PRP STRL LF DISP 70% ISPRP (MISCELLANEOUS) ×1
BLADE SURG 15 STRL LF DISP TIS (BLADE) ×2 IMPLANT
BLADE SURG 15 STRL SS (BLADE) ×2
BNDG CMPR 5X3 KNIT ELC UNQ LF (GAUZE/BANDAGES/DRESSINGS) ×1
BNDG CMPR 9X4 STRL LF SNTH (GAUZE/BANDAGES/DRESSINGS)
BNDG ELASTIC 3INX 5YD STR LF (GAUZE/BANDAGES/DRESSINGS) ×1 IMPLANT
BNDG ESMARK 4X9 LF (GAUZE/BANDAGES/DRESSINGS) IMPLANT
BNDG GAUZE DERMACEA FLUFF 4 (GAUZE/BANDAGES/DRESSINGS) ×1 IMPLANT
BNDG GZE DERMACEA 4 6PLY (GAUZE/BANDAGES/DRESSINGS) ×1
CHLORAPREP W/TINT 26 (MISCELLANEOUS) ×1 IMPLANT
CORD BIPOLAR FORCEPS 12FT (ELECTRODE) ×1 IMPLANT
COVER BACK TABLE 60X90IN (DRAPES) ×1 IMPLANT
COVER MAYO STAND STRL (DRAPES) ×1 IMPLANT
CUFF TOURN SGL QUICK 18X4 (TOURNIQUET CUFF) ×1 IMPLANT
DRAPE EXTREMITY T 121X128X90 (DISPOSABLE) ×1 IMPLANT
DRAPE SURG 17X23 STRL (DRAPES) ×1 IMPLANT
GAUZE PAD ABD 8X10 STRL (GAUZE/BANDAGES/DRESSINGS) ×1 IMPLANT
GAUZE SPONGE 4X4 12PLY STRL (GAUZE/BANDAGES/DRESSINGS) ×1 IMPLANT
GAUZE XEROFORM 1X8 LF (GAUZE/BANDAGES/DRESSINGS) ×1 IMPLANT
GLOVE BIO SURGEON STRL SZ7.5 (GLOVE) ×1 IMPLANT
GLOVE BIOGEL PI IND STRL 8 (GLOVE) ×1 IMPLANT
GOWN STRL REUS W/ TWL LRG LVL3 (GOWN DISPOSABLE) ×1 IMPLANT
GOWN STRL REUS W/TWL LRG LVL3 (GOWN DISPOSABLE) ×1
GOWN STRL REUS W/TWL XL LVL3 (GOWN DISPOSABLE) ×1 IMPLANT
NDL HYPO 25X1 1.5 SAFETY (NEEDLE) ×1 IMPLANT
NEEDLE HYPO 25X1 1.5 SAFETY (NEEDLE) ×1
NS IRRIG 1000ML POUR BTL (IV SOLUTION) ×1 IMPLANT
PACK BASIN DAY SURGERY FS (CUSTOM PROCEDURE TRAY) ×1 IMPLANT
PADDING CAST ABS COTTON 4X4 ST (CAST SUPPLIES) ×1 IMPLANT
STOCKINETTE 4X48 STRL (DRAPES) ×1 IMPLANT
SUT ETHILON 4 0 PS 2 18 (SUTURE) ×1 IMPLANT
SYR BULB EAR ULCER 3OZ GRN STR (SYRINGE) ×1 IMPLANT
SYR CONTROL 10ML LL (SYRINGE) ×1 IMPLANT
TOWEL GREEN STERILE FF (TOWEL DISPOSABLE) ×2 IMPLANT
UNDERPAD 30X36 HEAVY ABSORB (UNDERPADS AND DIAPERS) ×1 IMPLANT

## 2023-01-11 NOTE — Anesthesia Preprocedure Evaluation (Signed)
Anesthesia Evaluation  Patient identified by MRN, date of birth, ID band Patient awake    Reviewed: Allergy & Precautions, NPO status , Patient's Chart, lab work & pertinent test results  Airway Mallampati: II  TM Distance: >3 FB Neck ROM: Full    Dental no notable dental hx.    Pulmonary neg pulmonary ROS   Pulmonary exam normal        Cardiovascular negative cardio ROS  Rhythm:Regular Rate:Normal     Neuro/Psych MS  Neuromuscular disease  negative psych ROS   GI/Hepatic negative GI ROS, Neg liver ROS,,,  Endo/Other  Hypothyroidism    Renal/GU negative Renal ROS  negative genitourinary   Musculoskeletal CTS   Abdominal Normal abdominal exam  (+)   Peds  Hematology negative hematology ROS (+)   Anesthesia Other Findings   Reproductive/Obstetrics                             Anesthesia Physical Anesthesia Plan  ASA: 2  Anesthesia Plan: MAC and Bier Block and Bier Block-Lidocaine Only   Post-op Pain Management:    Induction: Intravenous  PONV Risk Score and Plan: 2 and Ondansetron, Dexamethasone, Propofol infusion, Midazolam and Treatment may vary due to age or medical condition  Airway Management Planned: Simple Face Mask and Nasal Cannula  Additional Equipment: None  Intra-op Plan:   Post-operative Plan:   Informed Consent: I have reviewed the patients History and Physical, chart, labs and discussed the procedure including the risks, benefits and alternatives for the proposed anesthesia with the patient or authorized representative who has indicated his/her understanding and acceptance.     Dental advisory given  Plan Discussed with: CRNA  Anesthesia Plan Comments:        Anesthesia Quick Evaluation

## 2023-01-11 NOTE — Discharge Instructions (Addendum)

## 2023-01-11 NOTE — Op Note (Addendum)
01/11/2023 [facility]                              OPERATIVE REPORT   PREOPERATIVE DIAGNOSIS:  Right carpal tunnel syndrome.  POSTOPERATIVE DIAGNOSIS:  Right carpal tunnel syndrome.  PROCEDURE:  Right carpal tunnel release.  SURGEON:  Betha Loa, MD  ASSISTANT:  none.  ANESTHESIA: Bier block with sedation  IV FLUIDS:  Per anesthesia flow sheet.  ESTIMATED BLOOD LOSS:  Minimal.  COMPLICATIONS:  None.  SPECIMENS:  None.  TOURNIQUET TIME:    Total Tourniquet Time Documented: Forearm (Right) - 23 minutes Total: Forearm (Right) - 23 minutes   DISPOSITION:  Stable to PACU.  LOCATION: [facility]  INDICATIONS:  52 y.o. yo female with numbness and tingling right hand.  Positive nerve conduction studies. She wishes to proceed with right carpal tunnel release.  Risks, benefits and alternatives of surgery were discussed including the risk of blood loss; infection; damage to nerves, vessels, tendons, ligaments, bone; failure of surgery; need for additional surgery; complications with wound healing; continued pain; recurrence of carpal tunnel syndrome; and damage to motor branch. She voiced understanding of these risks and elected to proceed.   OPERATIVE COURSE:  After being identified preoperatively by myself, the patient and I agreed upon the procedure and site of procedure.  The surgical site was marked.  Surgical consent had been signed.  She was given IV Ancef as preoperative antibiotic prophylaxis.  She was transferred to the operating room and placed on the operating room table in supine position with the Right upper extremity on an armboard.  Bier block anesthesia was induced by the anesthesiologist.  Right upper extremity was prepped and draped in normal sterile orthopaedic fashion.  A surgical pause was performed between the surgeons, anesthesia, and operating room staff, and all were in agreement as to the patient, procedure, and site of procedure.   Tourniquet at the proximal aspect of the forearm had been inflated for the Bier block  Incision was made over the transverse carpal ligament and carried into the subcutaneous tissues by spreading technique.  Bipolar electrocautery was used to obtain hemostasis.  The palmar fascia was sharply incised.  The transverse carpal ligament was identified.  The fascia distal to the ligament was opened.  Retractor was placed and the flexor tendons were identified.  The flexor tendon to the ring finger was identified and retracted radially.  The transverse carpal ligament was then incised from distal to proximal under direct visualization.  Scissors were used to split the distal aspect of the volar antebrachial fascia.  A finger was placed into the wound to ensure complete decompression, which was the case.  The nerve was examined.  It was flattened and hyperemic. It was adherent to the radial leaflet.  The motor branch was identified and was intact.  The wound was copiously irrigated with sterile saline.  It was then closed with 4-0 nylon in a horizontal mattress fashion.  It was injected with 0.25% plain Marcaine to aid in postoperative analgesia.  It was dressed with sterile Xeroform, 4x4s, an ABD, and wrapped with Kerlix and an Ace bandage.  Tourniquet was deflated at 23 minutes.  Fingertips were pink with brisk capillary refill after deflation of the tourniquet.  Operative drapes were broken down.  The patient was awoken from anesthesia safely.  She was transferred back to stretcher and taken to the PACU in stable condition.  I will see her back in the office in 1 week for postoperative followup.  I will give her a prescription for oxycodone 5mg  1 po q6 hours prn pain, dispense #15.    Betha Loa, MD Electronically signed, 01/11/23

## 2023-01-11 NOTE — H&P (Signed)
Samantha [name] is an 52 y.o. female.   Chief Complaint: carpal tunnel syndrome HPI: 52 y.o. yo female with numbness and tingling right hand.  Nocturnal symptoms. Positive nerve conduction studies. She wishes to have right carpal tunnel release.   Allergies: No Known Allergies  Past Medical History:  Diagnosis Date   H/O shoulder surgery    Hypothyroid    Multiple sclerosis (HCC)    Obese    Recurrent UTI 07/11/2013   Scaly skin 07/22/2015   Urinary incontinence in female 05/05/2016    Past Surgical History:  Procedure Laterality Date   SHOULDER ARTHROSCOPY Left    THYROIDECTOMY     TONSILLECTOMY      Family History: Family History  Problem Relation Age of Onset   Ovarian cancer Mother    Breast cancer Mother    Cervical cancer Mother     Social History:   reports that she has never smoked. She has never used smokeless tobacco. She reports current alcohol use. She reports that she does not use drugs.  Medications: Medications Prior to Admission  Medication Sig Dispense Refill   b complex vitamins capsule Take 1 capsule by mouth daily.     calcium carbonate (OSCAL) 1500 (600 Ca) MG TABS tablet Take by mouth 2 (two) times daily with a meal.     Cranberry 500 MG TABS Take by mouth.     cyclobenzaprine (FLEXERIL) 5 MG tablet Take 1 tablet (5 mg total) by mouth every 8 (eight) hours as needed for muscle spasms. 30 tablet 11   levothyroxine (SYNTHROID) 175 MCG tablet TAKE 1 TABLET BY MOUTH EVERY DAY BEFORE BREAKFAST 30 tablet 5   meloxicam (MOBIC) 7.5 MG tablet Take 7.5 mg by mouth daily.     modafinil (PROVIGIL) 200 MG tablet One po qd 30 tablet 5   Multiple Vitamin (MULTIVITAMIN) capsule Take by mouth.     nortriptyline (PAMELOR) 25 MG capsule TAKE 1 CAPSULE BY MOUTH EVERYDAY AT BEDTIME 30 capsule 8   Vitamin D, Ergocalciferol, (DRISDOL) 1.25 MG (50000 UNIT) CAPS capsule Take 1 capsule (50,000 Units total) by mouth every 7 (seven) days. 13 capsule 1   Ocrelizumab  (OCREVUS IV) Inject into the vein. Every 6 mths      No results found for this or any previous visit (from the past 48 hour(s)).  No results found.    Blood pressure (!) 157/85, pulse 75, temperature (!) 97.3 F (36.3 C), temperature source Temporal, resp. rate 20, height 5\' 5"  (1.651 m), weight 118.2 kg, last menstrual period 01/03/2013, SpO2 99%.  General appearance: alert, cooperative, and appears stated age Head: Normocephalic, without obvious abnormality, atraumatic Neck: supple, symmetrical, trachea midline Extremities: Intact sensation and capillary refill all digits.  +epl/fpl/io.  No wounds.  Pulses: 2+ and symmetric Skin: Skin color, texture, turgor normal. No rashes or lesions Neurologic: Grossly normal Incision/Wound: none  Assessment/Plan Right carpal tunnel syndrome.  Non operative and operative treatment options have been discussed with the patient and patient wishes to proceed with operative treatment. Risks, benefits and alternatives of surgery were discussed including risks of blood loss, infection, damage to nerves/vessels/tendons/ligament/bone, failure of surgery, need for additional surgery, complication with wound healing, stiffness, recurrence, damage to motor branch.  She voiced understanding of these risks and elected to proceed.    Betha Loa 01/11/2023, 12:55 PM

## 2023-01-11 NOTE — Transfer of Care (Signed)
Immediate Anesthesia Transfer of Care Note  Patient: Samantha [name]  Procedure(s) Performed: RIGHT CARPAL TUNNEL RELEASE (Right)  Patient Location: PACU  Anesthesia Type:MAC and Bier block  Level of Consciousness: awake, alert , and oriented  Airway & Oxygen Therapy: Patient Spontanous Breathing  Post-op Assessment: Report given to RN and Post -op Vital signs reviewed and stable  Post vital signs: Reviewed and stable  Last Vitals:  Vitals Value Taken Time  BP 117/88 01/11/23 1453  Temp    Pulse 86 01/11/23 1457  Resp 17 01/11/23 1457  SpO2 95 % 01/11/23 1457  Vitals shown include unfiled device data.  Last Pain:  Vitals:   01/11/23 1223  TempSrc: Temporal  PainSc: 0-No pain      Patients Stated Pain Goal: 3 (01/11/23 1223)  Complications: No notable events documented.

## 2023-01-11 NOTE — Anesthesia Procedure Notes (Signed)
Anesthesia Regional Block: Bier block (IV Regional)   Pre-Anesthetic Checklist: , timeout performed,  Correct Patient, Correct Site, Correct Laterality,  Correct Procedure,, site marked,  Surgical consent,  At surgeon's request  Laterality: Right and Upper         Needles:  Injection technique: Single-shot  Needle Type: Other      Needle Gauge: 22     Additional Needles:   Procedures:,,,,, intact distal pulses, Esmarch exsanguination,  Single tourniquet utilized    Narrative:  Start time: 01/11/2023 2:25 PM End time: 01/11/2023 2:25 PM Injection made incrementally with aspirations every 30 mL.  Performed by: Personally

## 2023-01-11 NOTE — Anesthesia Postprocedure Evaluation (Signed)
Anesthesia Post Note  Patient: Samantha [name]  Procedure(s) Performed: RIGHT CARPAL TUNNEL RELEASE (Right)     Patient location during evaluation: PACU Anesthesia Type: MAC and Bier Block Level of consciousness: awake and alert Pain management: pain level controlled Vital Signs Assessment: post-procedure vital signs reviewed and stable Respiratory status: spontaneous breathing, nonlabored ventilation, respiratory function stable and patient connected to nasal cannula oxygen Cardiovascular status: stable and blood pressure returned to baseline Postop Assessment: no apparent nausea or vomiting Anesthetic complications: no   No notable events documented.  Last Vitals:  Vitals:   01/11/23 1515 01/11/23 1524  BP: (!) 143/84 (!) 148/94  Pulse: 82 73  Resp: 15 16  Temp:  37.2 C  SpO2: 95% 96%    Last Pain:  Vitals:   01/11/23 1524  TempSrc:   PainSc: 0-No pain                 Earl Lites P Riti Rollyson

## 2023-01-12 ENCOUNTER — Encounter (HOSPITAL_BASED_OUTPATIENT_CLINIC_OR_DEPARTMENT_OTHER): Payer: Self-pay | Admitting: Orthopedic Surgery

## 2023-02-06 ENCOUNTER — Other Ambulatory Visit: Payer: Self-pay | Admitting: Nurse Practitioner

## 2023-02-06 DIAGNOSIS — M545 Low back pain, unspecified: Secondary | ICD-10-CM

## 2023-03-14 ENCOUNTER — Other Ambulatory Visit: Payer: Self-pay | Admitting: Neurology

## 2023-03-14 HISTORY — PX: CARPAL TUNNEL RELEASE: SHX101

## 2023-04-21 ENCOUNTER — Ambulatory Visit: Payer: 59 | Admitting: Neurology

## 2023-04-21 ENCOUNTER — Encounter: Payer: Self-pay | Admitting: Neurology

## 2023-04-21 VITALS — BP 150/95 | HR 82 | Ht 65.0 in | Wt 258.5 lb

## 2023-04-21 DIAGNOSIS — G5603 Carpal tunnel syndrome, bilateral upper limbs: Secondary | ICD-10-CM | POA: Diagnosis not present

## 2023-04-21 DIAGNOSIS — R208 Other disturbances of skin sensation: Secondary | ICD-10-CM

## 2023-04-21 DIAGNOSIS — Z79899 Other long term (current) drug therapy: Secondary | ICD-10-CM

## 2023-04-21 DIAGNOSIS — R3915 Urgency of urination: Secondary | ICD-10-CM

## 2023-04-21 DIAGNOSIS — G35 Multiple sclerosis: Secondary | ICD-10-CM

## 2023-04-21 DIAGNOSIS — R261 Paralytic gait: Secondary | ICD-10-CM

## 2023-04-21 MED ORDER — NORTRIPTYLINE HCL 25 MG PO CAPS: ORAL_CAPSULE | ORAL | 4 refills | Status: DC

## 2023-04-21 MED ORDER — MODAFINIL 200 MG PO TABS: ORAL_TABLET | ORAL | 5 refills | Status: DC

## 2023-04-21 MED ORDER — CYCLOBENZAPRINE HCL 5 MG PO TABS: 5.0000 mg | ORAL_TABLET | Freq: Every day | ORAL | 4 refills | Status: DC

## 2023-04-21 MED ORDER — VITAMIN D (ERGOCALCIFEROL) 1.25 MG (50000 UNIT) PO CAPS: 50000.0000 [IU] | ORAL_CAPSULE | ORAL | 1 refills | Status: AC

## 2023-04-21 NOTE — Progress Notes (Signed)
 Except 4+/5 APB muscles  [facility]  PATIENT: Samantha [name] DOB: [birth year]  REFERRING DOCTOR OR PCP: Rosina Gaucher, PA-C SOURCE: Patient, notes from [facility] (Dr. Jasmine), imaging and laboratory reports, MRI images ere personally reviewed  _________________________________   HISTORICAL  CHIEF COMPLAINT:  Chief Complaint  Patient presents with   Room 11    Pt is here Alone. Pt states that she has been doing okay since her last appointment.     HISTORY OF PRESENT ILLNESS:  Samantha [name] is a 53 y.o. woman with relapsing remitting multiple sclerosis diagnosed in 2012  Update 04/21/2023 She is currently on Ocrevus.  Next infusion is 05/10/2023.   She is tolerating it well.  When checked December 2023, IgG/IgM was normal.  CD19/20 were 0.5.   She feels some wearing off the last month of each cycle  She had bilateral CTR (first riht and recently left - will get stitches out in a couple days).    Gait is doing ok though balance is reduced and she needs rails on the stairs.    She feels strength is symmetric.  No foot drop.  She has some stumbles..     Mild leg dyseshesias.  She has some hand numbness felt to e due to CTS.    She has a T2 hyperintense focus at C2.  She notes more stiffness and pain in legs if she sits a long time.   This is also painful when she stands up.  She has spasms in her leg muscles, R>L but had no benefit from balcofen or tizanidine  so she stopped. This bothers her more at night.   .  Nortriptyline  may help dysesthesias some  She has urinary urgency only helped a bit by oxybutynin  so she stopped  Vision is ok - needs to use reading glasses more.     She has increased stress but denies depression.  She works third shift (11 pm to 7am) and sleeps poorly    Cognition is usually ok but has some word finding difficulties.    She has been on Vit D 50000 U weekly.  Last level ws 71 and we discussed decreasing to 05-4998 U  daily.    She had a NCV/EMG showing CTS.   She since has had bilateral CTR  MS history:  She was diagnosed with MS around 2012.   She woke up with numbness in her left face and tongue.    She had an MRI of the brain and was diagnosed with multiple sclerosis.   She was diagnosed in [place], [place].  She received IV steroids and numbness improved over time.    She was placed on Tecfidera x a couple years but had a relapse and then was placed on Plegridy.   She had difficulty with injections so switched to Ocrevus in 2019.   Her MS has done well and she has not had any exacerbations.     She is currently getting infusions at [facility] in [facility], [place].     She has been seeing Dr. Jasmine the past 3 years at [facility] but insurance company is making her switch.   IMAGING: Personally reviewed today MRI of the brain 08/12/2020 shows multiple T2/Flair hyperintense foci in the periventrixular, juxtacotical and deep white matter and 2 foci in cerebellar hemisphere.   No enhancement    No change (by report) compared to 2020.  MRI of the cervical spine 03/17/2017 shows a T2 hyperintense focus at  C2.   It was otherwise normal.  No enhancing lesions.  LABS: IgG was normal.  IgM borderline (57 at [facility]), Vit D was normal.  QuantiFERON TB and hepatitis chronic infection labs were normal in 2017.  REVIEW OF SYSTEMS: Constitutional: No fevers, chills, sweats, or change in appetite Eyes: No visual changes, double vision, eye pain Ear, nose and throat: No hearing loss, ear pain, nasal congestion, sore throat Cardiovascular: No chest pain, palpitations Respiratory:  No shortness of breath at rest or with exertion.   No wheezes GastrointestinaI: No nausea, vomiting, diarrhea, abdominal pain, fecal incontinence Genitourinary:  No dysuria, urinary retention or frequency.  No nocturia. Musculoskeletal:  No neck pain, back pain Integumentary: No rash, pruritus, skin lesions Neurological: as above Psychiatric: No  depression at this time.  No anxiety Endocrine: No palpitations, diaphoresis, change in appetite, change in weigh or increased thirst Hematologic/Lymphatic:  No anemia, purpura, petechiae. Allergic/Immunologic: No itchy/runny eyes, nasal congestion, recent allergic reactions, rashes  ALLERGIES: No Known Allergies  HOME MEDICATIONS:  Current Outpatient Medications:    b complex vitamins capsule, Take 1 capsule by mouth daily., Disp: , Rfl:    calcium carbonate (OSCAL) 1500 (600 Ca) MG TABS tablet, Take by mouth 2 (two) times daily with a meal., Disp: , Rfl:    Cranberry 500 MG TABS, Take by mouth., Disp: , Rfl:    levothyroxine  (SYNTHROID ) 175 MCG tablet, TAKE 1 TABLET BY MOUTH EVERY DAY BEFORE BREAKFAST, Disp: 30 tablet, Rfl: 5   meloxicam (MOBIC) 7.5 MG tablet, Take 7.5 mg by mouth daily., Disp: , Rfl:    Multiple Vitamin (MULTIVITAMIN) capsule, Take by mouth., Disp: , Rfl:    Ocrelizumab (OCREVUS IV), Inject into the vein. Every 6 mths, Disp: , Rfl:    oxyCODONE  (ROXICODONE ) 5 MG immediate release tablet, 1 tab PO q6 hours prn pain, Disp: 15 tablet, Rfl: 0   cyclobenzaprine  (FLEXERIL ) 5 MG tablet, Take 1 tablet (5 mg total) by mouth at bedtime., Disp: 90 tablet, Rfl: 4   modafinil  (PROVIGIL ) 200 MG tablet, One po qd, Disp: 30 tablet, Rfl: 5   nortriptyline  (PAMELOR ) 25 MG capsule, One po qd, Disp: 90 capsule, Rfl: 4   Vitamin D , Ergocalciferol , (DRISDOL ) 1.25 MG (50000 UNIT) CAPS capsule, Take 1 capsule (50,000 Units total) by mouth every 7 (seven) days., Disp: 13 capsule, Rfl: 1  PAST MEDICAL HISTORY: Past Medical History:  Diagnosis Date   H/O shoulder surgery    Hypothyroid    Multiple sclerosis (HCC)    Obese    Recurrent UTI 07/11/2013   Scaly skin 07/22/2015   Urinary incontinence in female 05/05/2016    PAST SURGICAL HISTORY: Past Surgical History:  Procedure Laterality Date   CARPAL TUNNEL RELEASE Right 01/11/2023   Procedure: RIGHT CARPAL TUNNEL RELEASE;  Surgeon:  Murrell Drivers, MD;  Location: [facility];  Service: Orthopedics;  Laterality: Right;  30 MIN   SHOULDER ARTHROSCOPY Left    THYROIDECTOMY     TONSILLECTOMY      FAMILY HISTORY: Family History  Problem Relation Age of Onset   Ovarian cancer Mother    Breast cancer Mother    Cervical cancer Mother     SOCIAL HISTORY:  Social History   Socioeconomic History   Marital status: Married    Spouse name: Not on file   Number of children: Not on file   Years of education: Not on file   Highest education level: Not on file  Occupational History   Not  on file  Tobacco Use   Smoking status: Never   Smokeless tobacco: Never  Vaping Use   Vaping status: Never Used  Substance and Sexual Activity   Alcohol use: Yes    Comment: social   Drug use: No   Sexual activity: Yes    Birth control/protection: I.U.D.    Comment: Mirena  Other Topics Concern   Not on file  Social History Narrative   Not on file   Social Drivers of Health   Financial Resource Strain: Low Risk  (12/19/2019)   Received from [facility], [facility]   Overall Financial Resource Strain (CARDIA)    Difficulty of Paying Living Expenses: Not hard at all  Food Insecurity: No Food Insecurity (12/19/2019)   Received from [facility], [facility]   Hunger Vital Sign    Worried About Running Out of Food in the Last Year: Never true    Ran Out of Food in the Last Year: Never true  Transportation Needs: No Transportation Needs (12/19/2019)   Received from [facility], [facility]   [facility] - Transportation    In the past 12 months, has lack of transportation kept you from medical appointments or from getting medications?: No    Lack of Transportation (Non-Medical): No  Physical Activity: Sufficiently Active (12/19/2019)   Received from [facility], [facility]    Exercise Vital Sign    Days of Exercise per Week: 5 days    Minutes of Exercise per Session: 30 min  Stress: Stress Concern Present (12/19/2019)   Received from [facility], [facility]   [employer]ational [employer] - Occupational Stress Questionnaire    Feeling of Stress : To some extent  Social Connections: Unknown (12/19/2019)   Received from [facility], [facility]   Social Connection and Isolation Panel [NHANES]    Frequency of Communication with Friends and Family: More than three times a week    Frequency of Social Gatherings with Friends and Family: Twice a week    Attends Religious Services: Patient declined    Database Administrator or Organizations: Yes    Attends Banker Meetings: 1 to 4 times per year    Marital Status: Married  Catering Manager Violence: Not on file     PHYSICAL EXAM  Vitals:   04/21/23 1504  BP: (!) 150/95  Pulse: 82  Weight: 258 lb 8 oz (117.3 kg)  Height: 5' 5 (1.651 m)    Body mass index is 43.02 kg/m.   General: The patient is well-developed and well-nourished and in no acute distress  HEENT:  Head is [place]/AT.  Sclera are anicteric.      Skin: Extremities are without rash or  edema.  Musculoskeletal/Extremity:  Scars from CTR   Wearig left splint  Neurologic Exam  Mental status: The patient is alert and oriented x 3 at the time of the examination. The patient has apparent normal recent and remote memory, with an apparently normal attention span and concentration ability.   Speech is normal.  Cranial nerves: Extraocular movements are full.  Facial strength and sensation was normal.  No dysarthria.. No obvious hearing deficits are noted.  Motor:  Muscle bulk is normal.   Tone is slightly increased in legs.. Strength is  5 / 5 in all 4 extremities except 4+/5 APB muscles).  Sensory: Sensory testing is intact to pinprick, soft touch and  vibration sensation in all 4 extremities except slight reduced sensation in the right median distribution  Coordination: Cerebellar testing reveals good finger-nose-finger and heel-to-shin bilaterally.  Gait and station: Station is normal.   Gait is spastic and mildly wide. Tandem gait is wide.  . Romberg is negative.   Reflexes: Deep tendon reflexes are symmetric and normal bilaterally.       Lab Results  Component Value Date   TSH 13.700 (H) 12/08/2022       ASSESSMENT AND PLAN  Multiple sclerosis (HCC) - Plan: IgG, IgA, IgM, CBC with Differential/Platelet, MR BRAIN W WO CONTRAST  High risk medication use - Plan: IgG, IgA, IgM, CBC with Differential/Platelet  Bilateral carpal tunnel syndrome  Dysesthesia  Spastic gait - Plan: MR BRAIN W WO CONTRAST  Urinary urgency  1.  She will continue Ocrevus.  We will check IgG/IgM and also check MRI brain to determine if there is breakthrough activity and consider a different medication if this is occurring 2.   Continue Flexeril  for leg spasticity.     3.   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  Stay active and exercise regularly 5.    Return in 6 months or sooner if there are new or worsening neurologic symptoms.  Fay Swider A. Vear, MD, [facility] 04/21/2023, 3:40 PM Certified in Neurology, Clinical Neurophysiology, Sleep Medicine and Neuroimaging  [facility] [address], [address] [place], [place] [address] [phone]

## 2023-04-22 ENCOUNTER — Telehealth: Payer: Self-pay | Admitting: Neurology

## 2023-04-22 LAB — CBC WITH DIFFERENTIAL/PLATELET
Basophils Absolute: 0.1 10*3/uL (ref 0.0–0.2)
Basos: 1 %
EOS (ABSOLUTE): 0.7 10*3/uL — ABNORMAL HIGH (ref 0.0–0.4)
Eos: 6 %
Hematocrit: 41.8 % (ref 34.0–46.6)
Hemoglobin: 13.7 g/dL (ref 11.1–15.9)
Immature Grans (Abs): 0 10*3/uL (ref 0.0–0.1)
Immature Granulocytes: 0 %
Lymphocytes Absolute: 1.6 10*3/uL (ref 0.7–3.1)
Lymphs: 14 %
MCH: 30.2 pg (ref 26.6–33.0)
MCHC: 32.8 g/dL (ref 31.5–35.7)
MCV: 92 fL (ref 79–97)
Monocytes Absolute: 0.8 10*3/uL (ref 0.1–0.9)
Monocytes: 7 %
Neutrophils Absolute: 8 10*3/uL — ABNORMAL HIGH (ref 1.4–7.0)
Neutrophils: 72 %
Platelets: 312 10*3/uL (ref 150–450)
RBC: 4.54 x10E6/uL (ref 3.77–5.28)
RDW: 12.7 % (ref 11.7–15.4)
WBC: 11.2 10*3/uL — ABNORMAL HIGH (ref 3.4–10.8)

## 2023-04-22 LAB — IGG, IGA, IGM
IgA/Immunoglobulin A, Serum: 134 mg/dL (ref 87–352)
IgG (Immunoglobin G), Serum: 773 mg/dL (ref 586–1602)
IgM (Immunoglobulin M), Srm: 37 mg/dL (ref 26–217)

## 2023-04-22 NOTE — Telephone Encounter (Signed)
 [facility] NPR case #[id number] sent to GI [phone]

## 2023-05-06 ENCOUNTER — Encounter: Payer: Self-pay | Admitting: Neurology

## 2023-05-11 ENCOUNTER — Ambulatory Visit
Admission: RE | Admit: 2023-05-11 | Discharge: 2023-05-11 | Disposition: A | Discharge status: Home / Self Care | Payer: 59 | Source: Ambulatory Visit | Attending: Neurology | Admitting: Neurology

## 2023-05-11 DIAGNOSIS — G35 Multiple sclerosis: Secondary | ICD-10-CM

## 2023-05-11 DIAGNOSIS — R261 Paralytic gait: Secondary | ICD-10-CM

## 2023-05-11 MED ORDER — GADOPICLENOL 0.5 MMOL/ML IV SOLN
10.0000 mL | Freq: Once | INTRAVENOUS | Status: AC | PRN
  Administered 2023-05-11: 10 mL via INTRAVENOUS

## 2023-05-12 ENCOUNTER — Encounter: Payer: Self-pay | Admitting: Neurology

## 2023-05-18 ENCOUNTER — Other Ambulatory Visit: Payer: Self-pay | Admitting: Nurse Practitioner

## 2023-05-18 DIAGNOSIS — E039 Hypothyroidism, unspecified: Secondary | ICD-10-CM

## 2023-06-08 NOTE — Progress Notes (Unsigned)
 Madelaine Bhat, CMA,acting as a Neurosurgeon for [name], FNP.,have documented all relevant documentation on the behalf of [name], FNP,as directed by  [name], FNP while in the presence of [name], FNP.  Subjective:  Patient ID: Samantha [name] , female    DOB: 1970-10-05 , 53 y.o.   MRN: [id number]  No chief complaint on file.   HPI  Patient presents today for a allergic reaction. Patient reports she thinks the celebrex she got last week made her break out in red hives. Patient reports her whole body is itchy. She started the medication on Wednesday and then on Sunday it worsened. She has erythema to her right inner forearm. Denies shortness of breath. She has taken zyrtec without relief.      Past Medical History:  Diagnosis Date   H/O shoulder surgery    Hypothyroid    Multiple sclerosis (HCC)    Obese    Recurrent UTI 07/11/2013   Scaly skin 07/22/2015   Urinary incontinence in female 05/05/2016     Family History  Problem Relation Age of Onset   Ovarian cancer Mother    Breast cancer Mother    Cervical cancer Mother      Current Outpatient Medications:    b complex vitamins capsule, Take 1 capsule by mouth daily., Disp: , Rfl:    calcium carbonate (OSCAL) 1500 (600 Ca) MG TABS tablet, Take by mouth 2 (two) times daily with a meal., Disp: , Rfl:    Cranberry 500 MG TABS, Take by mouth., Disp: , Rfl:    cyclobenzaprine (FLEXERIL) 5 MG tablet, Take 1 tablet (5 mg total) by mouth at bedtime., Disp: 90 tablet, Rfl: 4   levothyroxine (SYNTHROID) 175 MCG tablet, TAKE 1 TABLET BY MOUTH EVERY DAY BEFORE BREAKFAST, Disp: 30 tablet, Rfl: 5   meloxicam (MOBIC) 7.5 MG tablet, Take 7.5 mg by mouth daily., Disp: , Rfl:    modafinil (PROVIGIL) 200 MG tablet, One po qd, Disp: 30 tablet, Rfl: 5   Multiple Vitamin (MULTIVITAMIN) capsule, Take by mouth., Disp: , Rfl:    nortriptyline (PAMELOR) 25 MG capsule, One po qd, Disp: 90 capsule, Rfl: 4   Ocrelizumab (OCREVUS  IV), Inject into the vein. Every 6 mths, Disp: , Rfl:    Vitamin D, Ergocalciferol, (DRISDOL) 1.25 MG (50000 UNIT) CAPS capsule, Take 1 capsule (50,000 Units total) by mouth every 7 (seven) days., Disp: 13 capsule, Rfl: 1   Allergies  Allergen Reactions   Celebrex [Celecoxib] Hives and Itching     Review of Systems  Constitutional: Negative.   Respiratory: Negative.    Cardiovascular: Negative.   Skin:  Positive for rash.       Itching all over     Today's Vitals   06/09/23 0828  BP: 130/80  Pulse: 79  Temp: 98.5 F (36.9 C)  TempSrc: Oral  Weight: 260 lb 3.2 oz (118 kg)  Height: 5\' 5"  (1.651 m)  PainSc: 0-No pain   Body mass index is 43.3 kg/m.  Wt Readings from Last 3 Encounters:  06/09/23 260 lb 3.2 oz (118 kg)  04/21/23 258 lb 8 oz (117.3 kg)  01/11/23 260 lb 9.3 oz (118.2 kg)     Objective:  Physical Exam Vitals reviewed.  Constitutional:      General: She is not in acute distress.    Appearance: Normal appearance. She is well-developed. She is obese.  HENT:     Head: Normocephalic and atraumatic.  Eyes:  Pupils: Pupils are equal, round, and reactive to light.  Cardiovascular:     Rate and Rhythm: Normal rate and regular rhythm.     Pulses: Normal pulses.     Heart sounds: Normal heart sounds. No murmur heard. Pulmonary:     Effort: Pulmonary effort is normal. No respiratory distress.     Breath sounds: Normal breath sounds. No wheezing.  Skin:    General: Skin is warm and dry.     Capillary Refill: Capillary refill takes less than 2 seconds.  Neurological:     General: No focal deficit present.     Mental Status: She is alert and oriented to person, place, and time.     Cranial Nerves: No cranial nerve deficit.     Motor: No weakness.  Psychiatric:        Mood and Affect: Mood normal.        Behavior: Behavior normal.        Thought Content: Thought content normal.        Judgment: Judgment normal.      Assessment And Plan:   Hives Assessment & Plan: Erythamatous hive rash to right forearm and has pruritus all over. Continue over the counter antihistamine  Orders: -     Triamcinolone Acetonide  Allergic reaction, initial encounter Assessment & Plan: Likely related to Celebrex this is the most recent new medication. Will add to allergy list. Kenalog given IM.  Orders: -     Triamcinolone Acetonide  COVID-19 vaccination declined Assessment & Plan: Declines covid 19 vaccine. Discussed risk of covid 80 and if she changes her mind about the vaccine to call the office. Education has been provided regarding the importance of this vaccine but patient still declined. Advised may receive this vaccine at local pharmacy or Health Dept.or vaccine clinic. Aware to provide a copy of the vaccination record if obtained from local pharmacy or Health Dept.  Encouraged to take multivitamin, vitamin d, vitamin c and zinc to increase immune system. Aware can call office if would like to have vaccine here at office. Verbalized acceptance and understanding.    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 Advised to aim for at least 150 minutes of exercise per week.      Return for keep same next. .  Patient was given opportunity to ask questions. Patient verbalized understanding of the plan and was able to repeat key elements of the plan. All questions were answered to their satisfaction.    Jeanell Sparrow, FNP, have reviewed all documentation for this visit. The documentation on 06/09/23 for the exam, diagnosis, procedures, and orders are all accurate and complete.   IF YOU HAVE BEEN REFERRED TO A  SPECIALIST, IT MAY TAKE 1-2 WEEKS TO SCHEDULE/PROCESS THE REFERRAL. IF YOU HAVE NOT HEARD FROM US/SPECIALIST IN TWO WEEKS, PLEASE GIVE [place] A CALL AT [phone].

## 2023-06-09 ENCOUNTER — Ambulatory Visit: Payer: 59 | Admitting: Nurse Practitioner

## 2023-06-09 ENCOUNTER — Encounter: Payer: Self-pay | Admitting: Nurse Practitioner

## 2023-06-09 VITALS — BP 130/80 | HR 79 | Temp 98.5°F | Ht 65.0 in | Wt 260.2 lb

## 2023-06-09 DIAGNOSIS — L509 Urticaria, unspecified: Secondary | ICD-10-CM | POA: Insufficient documentation

## 2023-06-09 DIAGNOSIS — E66813 Obesity, class 3: Secondary | ICD-10-CM

## 2023-06-09 DIAGNOSIS — Z2821 Immunization not carried out because of patient refusal: Secondary | ICD-10-CM | POA: Insufficient documentation

## 2023-06-09 DIAGNOSIS — T7840XA Allergy, unspecified, initial encounter: Secondary | ICD-10-CM | POA: Diagnosis not present

## 2023-06-09 DIAGNOSIS — E661 Drug-induced obesity: Secondary | ICD-10-CM

## 2023-06-09 DIAGNOSIS — Z6841 Body Mass Index (BMI) 40.0 and over, adult: Secondary | ICD-10-CM

## 2023-06-09 MED ORDER — TRIAMCINOLONE ACETONIDE 40 MG/ML IJ SUSP
40.0000 mg | Freq: Once | INTRAMUSCULAR | Status: AC
Start: 2023-06-09 — End: 2023-06-09
  Administered 2023-06-09: 40 mg via INTRAMUSCULAR

## 2023-06-09 NOTE — Assessment & Plan Note (Signed)
 She is encouraged to strive for BMI less than 30 to decrease cardiac risk. Advised to aim for at least 150 minutes of exercise per week.

## 2023-06-09 NOTE — Patient Instructions (Signed)
 Continue taking over the counter antihistamine to help with the itching

## 2023-06-09 NOTE — Assessment & Plan Note (Signed)
 Likely related to Celebrex this is the most recent new medication. Will add to allergy list. Kenalog given IM.

## 2023-06-09 NOTE — Assessment & Plan Note (Addendum)
 Erythamatous hive rash to right forearm and has pruritus all over. Continue over the counter antihistamine

## 2023-06-09 NOTE — Assessment & Plan Note (Signed)

## 2023-06-09 NOTE — Assessment & Plan Note (Signed)
 Declines shingrix, educated on disease process and is aware if he changes his mind to notify office

## 2023-06-15 ENCOUNTER — Ambulatory Visit: Payer: Self-pay | Admitting: Nurse Practitioner

## 2023-06-15 ENCOUNTER — Encounter: Payer: Self-pay | Admitting: Nurse Practitioner

## 2023-06-15 VITALS — BP 110/80 | HR 99 | Temp 98.1°F | Ht 65.0 in | Wt 255.0 lb

## 2023-06-15 DIAGNOSIS — Z0000 Encounter for general adult medical examination without abnormal findings: Secondary | ICD-10-CM

## 2023-06-15 DIAGNOSIS — R7309 Other abnormal glucose: Secondary | ICD-10-CM | POA: Diagnosis not present

## 2023-06-15 DIAGNOSIS — E66813 Obesity, class 3: Secondary | ICD-10-CM

## 2023-06-15 DIAGNOSIS — Z1211 Encounter for screening for malignant neoplasm of colon: Secondary | ICD-10-CM

## 2023-06-15 DIAGNOSIS — Z1589 Genetic susceptibility to other disease: Secondary | ICD-10-CM

## 2023-06-15 DIAGNOSIS — E661 Drug-induced obesity: Secondary | ICD-10-CM

## 2023-06-15 DIAGNOSIS — Z79899 Other long term (current) drug therapy: Secondary | ICD-10-CM

## 2023-06-15 DIAGNOSIS — Z6841 Body Mass Index (BMI) 40.0 and over, adult: Secondary | ICD-10-CM

## 2023-06-15 DIAGNOSIS — E039 Hypothyroidism, unspecified: Secondary | ICD-10-CM | POA: Diagnosis not present

## 2023-06-15 DIAGNOSIS — E7800 Pure hypercholesterolemia, unspecified: Secondary | ICD-10-CM | POA: Diagnosis not present

## 2023-06-15 DIAGNOSIS — G35 Multiple sclerosis: Secondary | ICD-10-CM

## 2023-06-15 DIAGNOSIS — Z5320 Procedure and treatment not carried out because of patient's decision for unspecified reasons: Secondary | ICD-10-CM

## 2023-06-15 DIAGNOSIS — Z124 Encounter for screening for malignant neoplasm of cervix: Secondary | ICD-10-CM

## 2023-06-15 MED ORDER — LEVOTHYROXINE SODIUM 175 MCG PO TABS: 175.0000 ug | ORAL_TABLET | Freq: Every day | ORAL | 1 refills | Status: DC

## 2023-06-15 NOTE — Progress Notes (Signed)
 I,Samantha [name], CMA,acting as a Neurosurgeon for [employer], FNP.,have documented all relevant documentation on the behalf of Samantha Felts, FNP,as directed by  Samantha Felts, FNP while in the presence of Samantha Felts, FNP.  Subjective:    Patient ID: Samantha [name] , female    DOB: 1970-09-04 , 53 y.o.   MRN: [id number]  Chief Complaint  Patient presents with   Annual Exam    HPI  Patient is here for HM. Patient states compliance with medications and has no other concerns today. Patient is established with GYN.      Past Medical History:  Diagnosis Date   H/O shoulder surgery    Hypothyroid    Multiple sclerosis (HCC)    Obese    Recurrent UTI 07/11/2013   Scaly skin 07/22/2015   Urinary incontinence in female 05/05/2016     Family History  Problem Relation Age of Onset   Ovarian cancer Mother    Breast cancer Mother    Cervical cancer Mother      Current Outpatient Medications:    b complex vitamins capsule, Take 1 capsule by mouth daily., Disp: , Rfl:    calcium carbonate (OSCAL) 1500 (600 Ca) MG TABS tablet, Take by mouth 2 (two) times daily with a meal., Disp: , Rfl:    Cranberry 500 MG TABS, Take by mouth., Disp: , Rfl:    cyclobenzaprine (FLEXERIL) 5 MG tablet, Take 1 tablet (5 mg total) by mouth at bedtime., Disp: 90 tablet, Rfl: 4   meloxicam (MOBIC) 7.5 MG tablet, Take 7.5 mg by mouth daily., Disp: , Rfl:    modafinil (PROVIGIL) 200 MG tablet, One po qd, Disp: 30 tablet, Rfl: 5   Multiple Vitamin (MULTIVITAMIN) capsule, Take by mouth., Disp: , Rfl:    nortriptyline (PAMELOR) 25 MG capsule, One po qd, Disp: 90 capsule, Rfl: 4   Ocrelizumab (OCREVUS IV), Inject into the vein. Every 6 mths, Disp: , Rfl:    Vitamin D, Ergocalciferol, (DRISDOL) 1.25 MG (50000 UNIT) CAPS capsule, Take 1 capsule (50,000 Units total) by mouth every 7 (seven) days., Disp: 13 capsule, Rfl: 1   levothyroxine (SYNTHROID) 200 MCG tablet, Take 1 tablet (200 mcg total) by mouth daily  before breakfast., Disp: 90 tablet, Rfl: 1   Allergies  Allergen Reactions   Celebrex [Celecoxib] Hives and Itching      The patient states she uses IUD for birth control.   Negative for Dysmenorrhea and Negative for Menorrhagia. Negative for: breast discharge, breast lump(s), breast pain and breast self exam. Associated symptoms include abnormal vaginal bleeding. Pertinent negatives include abnormal bleeding (hematology), anxiety, decreased libido, depression, difficulty falling sleep, dyspareunia, history of infertility, nocturia, sexual dysfunction, sleep disturbances, urinary incontinence, urinary urgency, vaginal discharge and vaginal itching. 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   The patient's tobacco use is:  Social History   Tobacco Use  Smoking Status Never  Smokeless Tobacco Never  . She has been exposed to passive smoke. The patient's alcohol use is:  Social History   Substance and Sexual Activity  Alcohol Use Yes   Comment: social   Additional information: Last pap 08/05/2021, next one scheduled for 08/05/2024.    Review of Systems  Constitutional: Negative.   HENT: Negative.    Eyes: Negative.   Respiratory: Negative.    Cardiovascular: Negative.   Gastrointestinal: Negative.   Endocrine: Negative.   Genitourinary: Negative.   Musculoskeletal: Negative.  Skin: Negative.   Allergic/Immunologic: Negative.   Neurological: Negative.   Hematological: Negative.   Psychiatric/Behavioral: Negative.       Today's Vitals   06/15/23 1405  BP: 110/80  Pulse: 99  Temp: 98.1 F (36.7 C)  Weight: 255 lb (115.7 kg)  Height: 5\' 5"  (1.651 m)  PainSc: 0-No pain   Body mass index is 42.43 kg/m.  Wt Readings from Last 3 Encounters:  06/15/23 255 lb (115.7 kg)  06/09/23 260 lb 3.2 oz (118 kg)  04/21/23 258 lb 8 oz (117.3 kg)     Objective:  Physical Exam Vitals reviewed.  Constitutional:       General: She is not in acute distress.    Appearance: Normal appearance. She is well-developed. She is obese.  HENT:     Head: Normocephalic and atraumatic.  Eyes:     Pupils: Pupils are equal, round, and reactive to light.  Cardiovascular:     Rate and Rhythm: Normal rate and regular rhythm.     Pulses: Normal pulses.     Heart sounds: Normal heart sounds. No murmur heard. Pulmonary:     Effort: Pulmonary effort is normal. No respiratory distress.     Breath sounds: Normal breath sounds. No wheezing.  Skin:    General: Skin is warm and dry.     Capillary Refill: Capillary refill takes less than 2 seconds.  Neurological:     General: No focal deficit present.     Mental Status: She is alert and oriented to person, place, and time.     Cranial Nerves: No cranial nerve deficit.     Motor: No weakness.  Psychiatric:        Mood and Affect: Mood normal.        Behavior: Behavior normal.        Thought Content: Thought content normal.        Judgment: Judgment normal.         Assessment And Plan:     Annual physical exam Assessment & Plan: Behavior modifications discussed and diet history reviewed.   Pt will continue to exercise regularly and modify diet with low GI, plant based foods and decrease intake of processed foods.  Recommend intake of daily multivitamin, Vitamin D, and calcium.  Recommend mammogram and colonoscopy for preventive screenings, as well as recommend immunizations that include influenza, TDAP, and Shingles    Hypothyroidism, unspecified type Assessment & Plan: TSH was slightly elevated at last visit. continue current medications. Pending labs will make medication adjustments  Orders: -     TSH + free T4  Elevated LDL cholesterol level Assessment & Plan: Cholesterol levels are slightly elevated, will check lipid panel. Encouraged to limit intake of fatty foods.   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 Advised to aim for at least 150 minutes of exercise per week.    Encounter for Papanicolaou smear of cervix -     3D Screening Mammogram, Left and Right; Future  PTEN gene mutation Assessment & Plan: Referred to GI to get scheduled for colonoscopy due to high risk  Orders: -     Ambulatory referral to Gastroenterology  Encounter for screening colonoscopy Assessment & Plan: According to USPTF Colorectal cancer Screening guidelines. Colonoscopy is recommended 3 years due to her history of PTEN gene mutation  Will refer to GI for colon cancer screening.   Orders: -     Ambulatory referral to Gastroenterology  HIV screening declined  Multiple sclerosis [facility]) Assessment & Plan: Continue f/u with Neurology      Return for 1 year physical. Patient was given opportunity to ask questions. Patient verbalized understanding of the plan and was able to repeat key elements of the plan. All questions were answered to their satisfaction.   Samantha Felts, FNP  I, Samantha Felts, FNP, have reviewed all documentation for this visit. The documentation on 06/15/23 for the exam, diagnosis, procedures, and orders are all accurate and complete.

## 2023-06-15 NOTE — Patient Instructions (Addendum)
 Call [facility] - [phone]   Health Maintenance, Female Adopting a healthy lifestyle and getting preventive care are important in promoting health and wellness. Ask your health care provider about: The right schedule for you to have regular tests and exams. Things you can do on your own to prevent diseases and keep yourself healthy. What should I know about diet, weight, and exercise? Eat a healthy diet  Eat a diet that includes plenty of vegetables, fruits, low-fat dairy products, and lean protein. Do not eat a lot of foods that are high in solid fats, added sugars, or sodium. Maintain a healthy weight Body mass index (BMI) is used to identify weight problems. It estimates body fat based on height and weight. Your health care provider can help determine your BMI and help you achieve or maintain a healthy weight. Get regular exercise Get regular exercise. This is one of the most important things you can do for your health. Most adults should: Exercise for at least 150 minutes each week. The exercise should increase your heart rate and make you sweat (moderate-intensity exercise). Do strengthening exercises at least twice a week. This is in addition to the moderate-intensity exercise. Spend less time sitting. Even light physical activity can be beneficial. Watch cholesterol and blood lipids Have your blood tested for lipids and cholesterol at 51 years of age, then have this test every 5 years. Have your cholesterol levels checked more often if: Your lipid or cholesterol levels are high. You are older than 51 years of age. You are at high risk for heart disease. What should I know about cancer screening? Depending on your health history and family history, you may need to have cancer screening at various ages. This may include screening for: Breast cancer. Cervical cancer. Colorectal cancer. Skin cancer. Lung cancer. What should I know about heart disease, diabetes, and high  blood pressure? Blood pressure and heart disease High blood pressure causes heart disease and increases the risk of stroke. This is more likely to develop in people who have high blood pressure readings or are overweight. Have your blood pressure checked: Every 3-5 years if you are 30-5 years of age. Every year if you are 51 years old or older. Diabetes Have regular diabetes screenings. This checks your fasting blood sugar level. Have the screening done: Once every three years after age 22 if you are at a normal weight and have a low risk for diabetes. More often and at a younger age if you are overweight or have a high risk for diabetes. What should I know about preventing infection? Hepatitis B If you have a higher risk for hepatitis B, you should be screened for this virus. Talk with your health care provider to find out if you are at risk for hepatitis B infection. Hepatitis C Testing is recommended for: Everyone born from [birth year] through 1963. Anyone with known risk factors for hepatitis C. Sexually transmitted infections (STIs) Get screened for STIs, including gonorrhea and chlamydia, if: You are sexually active and are younger than 51 years of age. You are older than 51 years of age and your health care provider tells you that you are at risk for this type of infection. Your sexual activity has changed since you were last screened, and you are at increased risk for chlamydia or gonorrhea. Ask your health care provider if you are at risk. Ask your health care provider about whether you are at high risk for HIV. Your health care provider may  recommend a prescription medicine to help prevent HIV infection. If you choose to take medicine to prevent HIV, you should first get tested for HIV. You should then be tested every 3 months for as long as you are taking the medicine. Pregnancy If you are about to stop having your period (premenopausal) and you may become pregnant, seek counseling  before you get pregnant. Take 400 to 800 micrograms (mcg) of folic acid every day if you become pregnant. Ask for birth control (contraception) if you want to prevent pregnancy. Osteoporosis and menopause Osteoporosis is a disease in which the bones lose minerals and strength with aging. This can result in bone fractures. If you are 51 years old or older, or if you are at risk for osteoporosis and fractures, ask your health care provider if you should: Be screened for bone loss. Take a calcium or vitamin D supplement to lower your risk of fractures. Be given hormone replacement therapy (HRT) to treat symptoms of menopause. Follow these instructions at home: Alcohol use Do not drink alcohol if: Your health care provider tells you not to drink. You are pregnant, may be pregnant, or are planning to become pregnant. If you drink alcohol: Limit how much you have to: 0-1 drink a day. Know how much alcohol is in your drink. In the [place] one drink equals one 12 oz bottle of beer (355 mL), one 5 oz glass of wine (148 mL), or one 1 oz glass of hard liquor (44 mL). Lifestyle Do not use any products that contain nicotine or tobacco. These products include cigarettes, chewing tobacco, and vaping devices, such as e-cigarettes. If you need help quitting, ask your health care provider. Do not use street drugs. Do not share needles. Ask your health care provider for help if you need support or information about quitting drugs. General instructions Schedule regular health, dental, and eye exams. Stay current with your vaccines. Tell your health care provider if: You often feel depressed. You have ever been abused or do not feel safe at home. Summary Adopting a healthy lifestyle and getting preventive care are important in promoting health and wellness. Follow your health care provider's instructions about healthy diet, exercising, and getting tested or screened for diseases. Follow your health care  provider's instructions on monitoring your cholesterol and blood pressure. This information is not intended to replace advice given to you by your health care provider. Make sure you discuss any questions you have with your health care provider. Document Revised: 08/19/2020 Document Reviewed: 08/19/2020 Elsevier Patient Education  2024 [employer].

## 2023-06-16 ENCOUNTER — Encounter: Payer: Self-pay | Admitting: Nurse Practitioner

## 2023-06-16 ENCOUNTER — Other Ambulatory Visit: Payer: Self-pay | Admitting: Nurse Practitioner

## 2023-06-16 DIAGNOSIS — E039 Hypothyroidism, unspecified: Secondary | ICD-10-CM

## 2023-06-16 DIAGNOSIS — D72829 Elevated white blood cell count, unspecified: Secondary | ICD-10-CM

## 2023-06-16 LAB — CBC
Hematocrit: 43.2 % (ref 34.0–46.6)
Hemoglobin: 14.2 g/dL (ref 11.1–15.9)
MCH: 30.2 pg (ref 26.6–33.0)
MCHC: 32.9 g/dL (ref 31.5–35.7)
MCV: 92 fL (ref 79–97)
Platelets: 344 10*3/uL (ref 150–450)
RBC: 4.7 x10E6/uL (ref 3.77–5.28)
RDW: 13.1 % (ref 11.7–15.4)
WBC: 11.4 10*3/uL — ABNORMAL HIGH (ref 3.4–10.8)

## 2023-06-16 LAB — CMP14+EGFR
ALT: 12 IU/L (ref 0–32)
AST: 17 IU/L (ref 0–40)
Albumin: 4.4 g/dL (ref 3.9–4.9)
Alkaline Phosphatase: 117 IU/L (ref 44–121)
BUN/Creatinine Ratio: 13 (ref 9–23)
BUN: 11 mg/dL (ref 6–24)
Bilirubin Total: <0.2 mg/dL (ref 0.0–1.2)
CO2: 25 mmol/L (ref 20–29)
Calcium: 9.8 mg/dL (ref 8.7–10.2)
Chloride: 101 mmol/L (ref 96–106)
Creatinine, Ser: 0.82 mg/dL (ref 0.57–1.00)
Globulin, Total: 2.2 g/dL (ref 1.5–4.5)
Glucose: 89 mg/dL (ref 70–99)
Potassium: 4.5 mmol/L (ref 3.5–5.2)
Sodium: 141 mmol/L (ref 134–144)
Total Protein: 6.6 g/dL (ref 6.0–8.5)
eGFR: 87 mL/min/{1.73_m2} (ref >59)

## 2023-06-16 LAB — TSH+FREE T4
Free T4: 1.39 ng/dL (ref 0.82–1.77)
TSH: 9.11 u[IU]/mL — ABNORMAL HIGH (ref 0.450–4.500)

## 2023-06-16 MED ORDER — LEVOTHYROXINE SODIUM 200 MCG PO TABS: 200.0000 ug | ORAL_TABLET | Freq: Every day | ORAL | 1 refills | Status: DC

## 2023-06-17 ENCOUNTER — Encounter: Payer: Self-pay | Admitting: Nurse Practitioner

## 2023-06-17 ENCOUNTER — Other Ambulatory Visit

## 2023-06-17 DIAGNOSIS — D72829 Elevated white blood cell count, unspecified: Secondary | ICD-10-CM

## 2023-06-17 DIAGNOSIS — R82998 Other abnormal findings in urine: Secondary | ICD-10-CM

## 2023-06-17 LAB — POCT URINALYSIS DIP (CLINITEK)
Bilirubin, UA: NEGATIVE
Blood, UA: NEGATIVE
Glucose, UA: NEGATIVE mg/dL
Ketones, POC UA: NEGATIVE mg/dL
Nitrite, UA: NEGATIVE
POC PROTEIN,UA: NEGATIVE
Spec Grav, UA: 1.02 (ref 1.010–1.025)
Urobilinogen, UA: 0.2 U/dL
pH, UA: 5 (ref 5.0–8.0)

## 2023-06-19 LAB — URINE CULTURE

## 2023-06-20 DIAGNOSIS — Z1211 Encounter for screening for malignant neoplasm of colon: Secondary | ICD-10-CM | POA: Insufficient documentation

## 2023-06-20 DIAGNOSIS — Z0000 Encounter for general adult medical examination without abnormal findings: Secondary | ICD-10-CM | POA: Insufficient documentation

## 2023-06-20 NOTE — Assessment & Plan Note (Signed)

## 2023-06-20 NOTE — Assessment & Plan Note (Signed)
 Referred to GI to get scheduled for colonoscopy due to high risk

## 2023-06-20 NOTE — Assessment & Plan Note (Signed)
 Continue f/u with Neurology

## 2023-06-20 NOTE — Assessment & Plan Note (Signed)
 She is encouraged to strive for BMI less than 30 to decrease cardiac risk. Advised to aim for at least 150 minutes of exercise per week.

## 2023-06-20 NOTE — Assessment & Plan Note (Signed)
hgbA1c is stable, continue focusing on diet low in sugar and starches

## 2023-06-20 NOTE — Assessment & Plan Note (Signed)
Cholesterol levels are slightly elevated, will check lipid panel. Encouraged to limit intake of fatty foods.

## 2023-06-20 NOTE — Assessment & Plan Note (Signed)
 According to USPTF Colorectal cancer Screening guidelines. Colonoscopy is recommended 3 years due to her history of PTEN gene mutation  Will refer to GI for colon cancer screening.

## 2023-06-20 NOTE — Assessment & Plan Note (Signed)
 TSH was slightly elevated at last visit. continue current medications. Pending labs will make medication adjustments

## 2023-06-25 ENCOUNTER — Other Ambulatory Visit: Payer: Self-pay

## 2023-06-25 ENCOUNTER — Telehealth: Payer: Self-pay

## 2023-06-25 DIAGNOSIS — Z1211 Encounter for screening for malignant neoplasm of colon: Secondary | ICD-10-CM

## 2023-06-25 DIAGNOSIS — Z1589 Genetic susceptibility to other disease: Secondary | ICD-10-CM

## 2023-06-25 MED ORDER — NA SULFATE-K SULFATE-MG SULF 17.5-3.13-1.6 GM/177ML PO SOLN: 1.0000 | Freq: Once | ORAL | 0 refills | Status: AC

## 2023-06-25 NOTE — Telephone Encounter (Signed)
 Gastroenterology Pre-Procedure Review  Request Date: 07/23/23 Requesting Physician: Dr. Allegra Lai  PATIENT REVIEW QUESTIONS: The patient responded to the following health history questions as indicated:    1. Are you having any GI issues? no p-ten gene mutation 2. Do you have a personal history of Polyps? no 3. Do you have a family history of Colon Cancer or Polyps? no 4. Diabetes Mellitus? no 5. Joint replacements in the past 12 months?no 6. Major health problems in the past 3 months?no 7. Any artificial heart valves, MVP, or defibrillator?no    MEDICATIONS & ALLERGIES:    Patient reports the following regarding taking any anticoagulation/antiplatelet therapy:   Plavix, Coumadin, Eliquis, Xarelto, Lovenox, Pradaxa, Brilinta, or Effient? no Aspirin? no  Patient confirms/reports the following medications:  Current Outpatient Medications  Medication Sig Dispense Refill   b complex vitamins capsule Take 1 capsule by mouth daily.     calcium carbonate (OSCAL) 1500 (600 Ca) MG TABS tablet Take by mouth 2 (two) times daily with a meal.     Cranberry 500 MG TABS Take by mouth.     cyclobenzaprine (FLEXERIL) 5 MG tablet Take 1 tablet (5 mg total) by mouth at bedtime. 90 tablet 4   levothyroxine (SYNTHROID) 200 MCG tablet Take 1 tablet (200 mcg total) by mouth daily before breakfast. 90 tablet 1   meloxicam (MOBIC) 7.5 MG tablet Take 7.5 mg by mouth daily.     modafinil (PROVIGIL) 200 MG tablet One po qd 30 tablet 5   Multiple Vitamin (MULTIVITAMIN) capsule Take by mouth.     nortriptyline (PAMELOR) 25 MG capsule One po qd 90 capsule 4   Ocrelizumab (OCREVUS IV) Inject into the vein. Every 6 mths     Vitamin D, Ergocalciferol, (DRISDOL) 1.25 MG (50000 UNIT) CAPS capsule Take 1 capsule (50,000 Units total) by mouth every 7 (seven) days. 13 capsule 1   No current facility-administered medications for this visit.    Patient confirms/reports the following allergies:  Allergies  Allergen  Reactions   Celebrex [Celecoxib] Hives and Itching    No orders of the defined types were placed in this encounter.   AUTHORIZATION INFORMATION Primary Insurance: 1D#: Group #:  Secondary Insurance: 1D#: Group #:  SCHEDULE INFORMATION: Date: 07/23/23 Time: Location: [facility]

## 2023-07-06 ENCOUNTER — Ambulatory Visit
Admission: RE | Admit: 2023-07-06 | Discharge: 2023-07-06 | Disposition: A | Discharge status: Home / Self Care | Source: Ambulatory Visit | Attending: Nurse Practitioner | Admitting: Nurse Practitioner

## 2023-07-06 DIAGNOSIS — Z124 Encounter for screening for malignant neoplasm of cervix: Secondary | ICD-10-CM

## 2023-07-14 NOTE — Progress Notes (Unsigned)
 PCP: [name], FNP   No chief complaint on file.   HPI:      [name] is a 53 y.o. No obstetric history on file. whose LMP was No LMP recorded (lmp unknown). (Menstrual status: IUD)., presents today for her annual examination.  Her menses are absent due to Mirena IUD, placed about 5 yrs ago. She does not have intermenstrual bleeding. She does not have vasomotor sx.   Sex activity: single partner, contraception - IUD. Mirena about age 49. She does not have vaginal dryness.  Last Pap: 08/05/21 Results were NILM/neg HPV DNA. No hx of abn paps. Trichomonas on pap 4/23 and treated.  Last mammogram: 07/06/23  Results were: normal--routine follow-up in 12 months. Had neg breast MRI 3/19 and 11/20; declines repeat.  There is a FH of breast cancer and uterine cancer in her mom. Pt is PTEN positive, was followed at [facility] but insurance doesn't cover them now. The patient does do self-breast exams.  Colonoscopy: 12/18 at [facility];  Repeat due after 5 years for PTEN genetic mutation.   Tobacco use: The patient denies current or previous tobacco use. Alcohol use: none No drug use Exercise: moderately active  She does get adequate calcium and Vitamin D in her diet.  Labs with PCP.  PTEN MUTATION: Pt was being followed at [facility] but insurance is OON now with them, so had to transfer care to [facility] facilities.  NCCN [place] 2025:  Breast: yearly mammos, CBE and scr breast MRI yearly vs mastectomy; pt to schedule mammo, wants Q12 months CBE not Q6 months, declines breast MRI  Colorectal: colonscopy Q5 yrs; refer to GI for colonoscopy due 12/23  Derm/melanoma:  yearly skin check; pt can do appt on her own  Endometrial: EMB Q1-2 yrs , then u/s after menopause, consider hyst; pt spoke with genetic counselor and was told she didn't need EMB due to Mirena IUD. I will look into this.  09/01/21 ADDENDUM: Spoke to Archer Asa, GC at [facility]. After further research on her part, pt should  still get EMB Q1-2 yrs regardless of Mirena IUD.   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) 12/20/2022   Hypothyroidism 12/20/2022   Elevated LDL cholesterol level 12/20/2022   Herpes zoster vaccination declined 12/20/2022   Need for influenza vaccination 12/20/2022   B12 deficiency 12/20/2022   Abnormal glucose 12/20/2022   Bilateral carpal tunnel syndrome 08/02/2022   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   Multiple sclerosis (HCC) 03/18/2021   Dysesthesia 03/18/2021   Urinary urgency 03/18/2021   Spastic gait 03/18/2021   Hypothyroidism, 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   Unilateral vocal cord paralysis 05/21/2014   Vitiligo 01/09/2014   Neurogenic bladder 07/11/2013   Neuropathic pain 01/17/2013   ANA positive 10/04/2012   Thyroid nodule 09/07/2012    Past Surgical History:  Procedure Laterality Date   BREAST BIOPSY Right    CARPAL TUNNEL RELEASE Right 01/11/2023   Procedure: RIGHT CARPAL TUNNEL RELEASE;  Surgeon: Merlyn Lot,  Caryn Bee, MD;  Location: [facility];  Service: Orthopedics;  Laterality: Right;  30 MIN   SHOULDER ARTHROSCOPY Left    THYROIDECTOMY     TONSILLECTOMY      Family History  Problem Relation Age of Onset   Ovarian cancer Mother    Breast cancer Mother 51 - 52   Cervical cancer Mother     Social History   Socioeconomic History   Marital status: Married    Spouse name: Not on file   Number of children: Not on file   Years of education: Not on file    Highest education level: Some college, no degree  Occupational History   Not on file  Tobacco Use   Smoking status: Never   Smokeless tobacco: Never  Vaping Use   Vaping status: Never Used  Substance and Sexual Activity   Alcohol use: Yes    Comment: social   Drug use: No   Sexual activity: Yes    Birth control/protection: I.U.D.    Comment: Mirena  Other Topics Concern   Not on file  Social History Narrative   Not on file   Social Drivers of Health   Financial Resource Strain: Low Risk  (06/08/2023)   Overall Financial Resource Strain (CARDIA)    Difficulty of Paying Living Expenses: Not hard at all  Food Insecurity: No Food Insecurity (06/08/2023)   Hunger Vital Sign    Worried About Running Out of Food in the Last Year: Never true    Ran Out of Food in the Last Year: Never true  Transportation Needs: No Transportation Needs (06/08/2023)   PRAPARE - Administrator, Civil Service (Medical): No    Lack of Transportation (Non-Medical): No  Physical Activity: Insufficiently Active (06/08/2023)   Exercise Vital Sign    Days of Exercise per Week: 3 days    Minutes of Exercise per Session: 40 min  Stress: No Stress Concern Present (06/08/2023)   [employer] of Occupational Health - Occupational Stress Questionnaire    Feeling of Stress : Not at all  Social Connections: Unknown (06/08/2023)   Social Connection and Isolation Panel [NHANES]    Frequency of Communication with Friends and Family: Once a week    Frequency of Social Gatherings with Friends and Family: Once a week    Attends Religious Services: Patient declined    Database administrator or Organizations: No    Attends Engineer, structural: Not on file    Marital Status: Married  Catering manager Violence: Not on file     Current Outpatient Medications:    b complex vitamins capsule, Take 1 capsule by mouth daily., Disp: , Rfl:    calcium carbonate (OSCAL) 1500 (600 Ca) MG TABS tablet, Take  by mouth 2 (two) times daily with a meal., Disp: , Rfl:    Cranberry 500 MG TABS, Take by mouth., Disp: , Rfl:    cyclobenzaprine (FLEXERIL) 5 MG tablet, Take 1 tablet (5 mg total) by mouth at bedtime., Disp: 90 tablet, Rfl: 4   levothyroxine (SYNTHROID) 200 MCG tablet, Take 1 tablet (200 mcg total) by mouth daily before breakfast., Disp: 90 tablet, Rfl: 1   meloxicam (MOBIC) 7.5 MG tablet, Take 7.5 mg by mouth daily., Disp: , Rfl:    modafinil (PROVIGIL) 200 MG tablet, One po qd, Disp: 30 tablet, Rfl: 5   Multiple Vitamin (MULTIVITAMIN) capsule, Take by mouth., Disp: , Rfl:    nortriptyline (PAMELOR) 25 MG capsule, One  po qd, Disp: 90 capsule, Rfl: 4   Ocrelizumab (OCREVUS IV), Inject into the vein. Every 6 mths, Disp: , Rfl:    Vitamin D, Ergocalciferol, (DRISDOL) 1.25 MG (50000 UNIT) CAPS capsule, Take 1 capsule (50,000 Units total) by mouth every 7 (seven) days., Disp: 13 capsule, Rfl: 1     ROS:  Review of Systems  Constitutional:  Negative for fatigue, fever and unexpected weight change.  Respiratory:  Negative for cough, shortness of breath and wheezing.   Cardiovascular:  Negative for chest pain, palpitations and leg swelling.  Gastrointestinal:  Negative for blood in stool, constipation, diarrhea, nausea and vomiting.  Endocrine: Negative for cold intolerance, heat intolerance and polyuria.  Genitourinary:  Negative for dyspareunia, dysuria, flank pain, frequency, genital sores, hematuria, menstrual problem, pelvic pain, urgency, vaginal bleeding, vaginal discharge and vaginal pain.  Musculoskeletal:  Negative for back pain, joint swelling and myalgias.  Skin:  Negative for rash.  Neurological:  Negative for dizziness, syncope, light-headedness, numbness and headaches.  Hematological:  Negative for adenopathy.  Psychiatric/Behavioral:  Negative for agitation, confusion, sleep disturbance and suicidal ideas. The patient is not nervous/anxious.    BREAST: No  symptoms    Objective: LMP  (LMP Unknown)    Physical Exam Constitutional:      Appearance: She is well-developed.  Genitourinary:     Vulva normal.     Right Labia: No rash, tenderness or lesions.    Left Labia: No tenderness, lesions or rash.    No vaginal discharge, erythema or tenderness.      Right Adnexa: not tender and no mass present.    Left Adnexa: not tender and no mass present.    No cervical friability or polyp.     IUD strings visualized.     Uterus is not enlarged or tender.  Breasts:    Right: No mass, nipple discharge, skin change or tenderness.     Left: No mass, nipple discharge, skin change or tenderness.  Neck:     Thyroid: No thyromegaly.  Cardiovascular:     Rate and Rhythm: Normal rate and regular rhythm.     Heart sounds: Normal heart sounds. No murmur heard. Pulmonary:     Effort: Pulmonary effort is normal.     Breath sounds: Normal breath sounds.  Abdominal:     Palpations: Abdomen is soft.     Tenderness: There is no abdominal tenderness. There is no guarding or rebound.  Musculoskeletal:        General: Normal range of motion.     Cervical back: Normal range of motion.  Lymphadenopathy:     Cervical: No cervical adenopathy.  Neurological:     General: No focal deficit present.     Mental Status: She is alert and oriented to person, place, and time.     Cranial Nerves: No cranial nerve deficit.  Skin:    General: Skin is warm and dry.  Psychiatric:        Mood and Affect: Mood normal.        Behavior: Behavior normal.        Thought Content: Thought content normal.        Judgment: Judgment normal.  Vitals reviewed.     Assessment/Plan:  Encounter for annual routine gynecological examination  Cervical cancer screening - Plan: Cytology - PAP  Screening for HPV (human papillomavirus) - Plan: Cytology - PAP  Encounter for routine checking of intrauterine contraceptive device (IUD)--IUD strings in cx os; has 8 yr indication.  Encounter for screening mammogram for malignant neoplasm of breast; pt has mammo order. Instructed to call for scr breast MRI after mammo if she wants imaging. Pt declines scr MRI for now. Cont SBE, yearly CBE.   PTEN gene mutation - Plan: Ambulatory referral to Gastroenterology; see notes in HPI. Refer to GI for scr colonoscopy due 12/23. Checking on need for endometrial bx with GC at [facility].   Screening for colon cancer - Plan: Ambulatory referral to Gastroenterology          GYN counsel breast self exam, mammography screening, menopause, adequate intake of calcium and vitamin D, diet and exercise    F/U  No follow-ups on file.  Darionna Banke B. Hiroko Tregre, PA-C 07/14/2023 9:03 PM  09/01/21 LM for pt that she is still do for Q1-2 yr EMB per GC. Pt to call to schedule if desires.

## 2023-07-15 ENCOUNTER — Ambulatory Visit (INDEPENDENT_AMBULATORY_CARE_PROVIDER_SITE_OTHER): Admitting: Obstetrics and Gynecology

## 2023-07-15 ENCOUNTER — Encounter: Payer: Self-pay | Admitting: Obstetrics and Gynecology

## 2023-07-15 ENCOUNTER — Encounter: Payer: Self-pay | Admitting: Gastroenterology

## 2023-07-15 VITALS — BP 119/77 | HR 106 | Ht 65.0 in | Wt 254.0 lb

## 2023-07-15 DIAGNOSIS — Z30431 Encounter for routine checking of intrauterine contraceptive device: Secondary | ICD-10-CM

## 2023-07-15 DIAGNOSIS — Z01419 Encounter for gynecological examination (general) (routine) without abnormal findings: Secondary | ICD-10-CM

## 2023-07-15 DIAGNOSIS — Z1211 Encounter for screening for malignant neoplasm of colon: Secondary | ICD-10-CM

## 2023-07-15 DIAGNOSIS — Z1589 Genetic susceptibility to other disease: Secondary | ICD-10-CM

## 2023-07-15 DIAGNOSIS — Z1231 Encounter for screening mammogram for malignant neoplasm of breast: Secondary | ICD-10-CM

## 2023-07-15 DIAGNOSIS — A599 Trichomoniasis, unspecified: Secondary | ICD-10-CM

## 2023-07-15 NOTE — Patient Instructions (Signed)
 I value your feedback and you entrusting [place] with your care. If you get a [place] patient survey, I would appreciate you taking the time to let us know about your experience today. Thank you! ? ? ?

## 2023-07-23 ENCOUNTER — Ambulatory Visit: Payer: Self-pay | Admitting: Anesthesiology

## 2023-07-23 ENCOUNTER — Other Ambulatory Visit: Payer: Self-pay

## 2023-07-23 ENCOUNTER — Encounter: Payer: Self-pay | Admitting: Gastroenterology

## 2023-07-23 ENCOUNTER — Encounter
Admission: RE | Disposition: A | Discharge status: Home / Self Care | Payer: Self-pay | Source: Home / Self Care | Attending: Gastroenterology

## 2023-07-23 ENCOUNTER — Ambulatory Visit
Admission: RE | Admit: 2023-07-23 | Discharge: 2023-07-23 | Disposition: A | Discharge status: Home / Self Care | Attending: Gastroenterology | Admitting: Gastroenterology

## 2023-07-23 DIAGNOSIS — K644 Residual hemorrhoidal skin tags: Secondary | ICD-10-CM | POA: Insufficient documentation

## 2023-07-23 DIAGNOSIS — D175 Benign lipomatous neoplasm of intra-abdominal organs: Secondary | ICD-10-CM | POA: Diagnosis not present

## 2023-07-23 DIAGNOSIS — D124 Benign neoplasm of descending colon: Secondary | ICD-10-CM | POA: Insufficient documentation

## 2023-07-23 DIAGNOSIS — Z79890 Hormone replacement therapy (postmenopausal): Secondary | ICD-10-CM | POA: Diagnosis not present

## 2023-07-23 DIAGNOSIS — Z1211 Encounter for screening for malignant neoplasm of colon: Secondary | ICD-10-CM | POA: Insufficient documentation

## 2023-07-23 DIAGNOSIS — E039 Hypothyroidism, unspecified: Secondary | ICD-10-CM | POA: Diagnosis not present

## 2023-07-23 DIAGNOSIS — D125 Benign neoplasm of sigmoid colon: Secondary | ICD-10-CM | POA: Diagnosis not present

## 2023-07-23 DIAGNOSIS — K5730 Diverticulosis of large intestine without perforation or abscess without bleeding: Secondary | ICD-10-CM | POA: Diagnosis not present

## 2023-07-23 DIAGNOSIS — K635 Polyp of colon: Secondary | ICD-10-CM | POA: Diagnosis not present

## 2023-07-23 DIAGNOSIS — Z1589 Genetic susceptibility to other disease: Secondary | ICD-10-CM

## 2023-07-23 DIAGNOSIS — D122 Benign neoplasm of ascending colon: Secondary | ICD-10-CM | POA: Diagnosis not present

## 2023-07-23 HISTORY — PX: POLYPECTOMY: SHX149

## 2023-07-23 HISTORY — DX: Other specified health status: Z78.9

## 2023-07-23 HISTORY — PX: COLONOSCOPY: SHX5424

## 2023-07-23 LAB — POCT PREGNANCY, URINE: Preg Test, Ur: NEGATIVE

## 2023-07-23 SURGERY — COLONOSCOPY
Anesthesia: General | Site: Rectum

## 2023-07-23 MED ORDER — SODIUM CHLORIDE 0.9 % IV SOLN: INTRAVENOUS | Status: DC

## 2023-07-23 MED ORDER — LACTATED RINGERS IV SOLN: INTRAVENOUS | Status: DC

## 2023-07-23 MED ORDER — PROPOFOL 10 MG/ML IV BOLUS
INTRAVENOUS | Status: DC | PRN
  Administered 2023-07-23: 50 mg via INTRAVENOUS
  Administered 2023-07-23: 100 mg via INTRAVENOUS
  Administered 2023-07-23 (×2): 50 mg via INTRAVENOUS
  Administered 2023-07-23: 100 mg via INTRAVENOUS
  Administered 2023-07-23: 50 mg via INTRAVENOUS

## 2023-07-23 MED ORDER — STERILE WATER FOR IRRIGATION IR SOLN
Status: DC | PRN
  Administered 2023-07-23: 50 mL

## 2023-07-23 MED ORDER — LIDOCAINE HCL (CARDIAC) PF 100 MG/5ML IV SOSY
PREFILLED_SYRINGE | INTRAVENOUS | Status: DC | PRN
  Administered 2023-07-23: 50 mg via INTRAVENOUS

## 2023-07-23 MED ORDER — STERILE WATER FOR IRRIGATION IR SOLN
Status: DC | PRN
  Administered 2023-07-23: 60 mL

## 2023-07-23 MED ORDER — PROPOFOL 10 MG/ML IV BOLUS
INTRAVENOUS | Status: AC
  Filled 2023-07-23: qty 20

## 2023-07-23 SURGICAL SUPPLY — 7 items
GAUZE SPONGE 4X4 12PLY STRL (GAUZE/BANDAGES/DRESSINGS) IMPLANT
GOWN CVR UNV OPN BCK APRN NK (MISCELLANEOUS) ×4 IMPLANT
KIT PRC NS LF DISP ENDO (KITS) ×2 IMPLANT
MANIFOLD NEPTUNE II (INSTRUMENTS) ×2 IMPLANT
SNARE COLD EXACTO (MISCELLANEOUS) IMPLANT
TRAP ETRAP POLY (MISCELLANEOUS) IMPLANT
WATER STERILE IRR 250ML POUR (IV SOLUTION) ×2 IMPLANT

## 2023-07-23 NOTE — Op Note (Signed)
 [facility] Gastroenterology Patient Name: Samantha [name] Procedure Date: 07/23/2023 11:31 AM MRN: [id number] Account #: [id number] Date of Birth: [birth year] Admit Type: Outpatient Age: 53 Room: [facility] OR ROOM 01 Gender: Female Note Status: Finalized Instrument Name: [id number] Procedure:             Colonoscopy Indications:           Screening for colorectal malignant neoplasm, This is                         the patient's first colonoscopy Providers:             Toney Reil MD, MD Referring MD:          [name] (Referring MD) Medicines:             General Anesthesia Complications:         No immediate complications. Estimated blood loss: None. Procedure:             Pre-Anesthesia Assessment:                        - Prior to the procedure, a History and Physical was                         performed, and patient medications and allergies were                         reviewed. The patient is competent. The risks and                         benefits of the procedure and the sedation options and                         risks were discussed with the patient. All questions                         were answered and informed consent was obtained.                         Patient identification and proposed procedure were                         verified by the physician, the nurse, the                         anesthesiologist, the anesthetist and the technician                         in the pre-procedure area in the procedure room in the                         endoscopy suite. Mental Status Examination: alert and                         oriented. Airway Examination: normal oropharyngeal                         airway and neck mobility. Respiratory Examination:  clear to auscultation. CV Examination: normal.                         Prophylactic Antibiotics: The patient does not require                         prophylactic  antibiotics. Prior Anticoagulants: The                         patient has taken no anticoagulant or antiplatelet                         agents. ASA Grade Assessment: III - A patient with                         severe systemic disease. After reviewing the risks and                         benefits, the patient was deemed in satisfactory                         condition to undergo the procedure. The anesthesia                         plan was to use general anesthesia. Immediately prior                         to administration of medications, the patient was                         re-assessed for adequacy to receive sedatives. The                         heart rate, respiratory rate, oxygen saturations,                         blood pressure, adequacy of pulmonary ventilation, and                         response to care were monitored throughout the                         procedure. The physical status of the patient was                         re-assessed after the procedure.                        After obtaining informed consent, the colonoscope was                         passed under direct vision. Throughout the procedure,                         the patient's blood pressure, pulse, and oxygen                         saturations were monitored continuously. The  Colonoscope was introduced through the anus and                         advanced to the the terminal ileum, with                         identification of the appendiceal orifice and IC                         valve. The colonoscopy was performed without                         difficulty. The patient tolerated the procedure well.                         The quality of the bowel preparation was evaluated                         using the BBPS [facility] Bowel Preparation Scale) with                         scores of: Right Colon = 3, Transverse Colon = 3 and                         Left Colon = 3  (entire mucosa seen well with no                         residual staining, small fragments of stool or opaque                         liquid). The total BBPS score equals 9. The terminal                         ileum, ileocecal valve, appendiceal orifice, and                         rectum were photographed. Findings:      The perianal and digital rectal examinations were normal. Pertinent       negatives include normal sphincter tone and no palpable rectal lesions.      A diminutive polyp was found in the proximal ascending colon. The polyp       was sessile. The polyp was removed with a cold snare. Resection was       complete, but the polyp tissue was not retrieved. Estimated blood loss:       none.      Four sessile polyps were found in the sigmoid colon and descending       colon. The polyps were 3 to 4 mm in size. These polyps were removed with       a cold snare. Resection and retrieval were complete. Estimated blood       loss: none.      Multiple large-mouthed and medium-mouthed diverticula were found in the       recto-sigmoid colon, sigmoid colon and descending colon.      Non-bleeding external hemorrhoids were found during retroflexion. The       hemorrhoids were medium-sized.      The terminal ileum appeared normal. Impression:            -  One diminutive polyp in the proximal ascending                         colon, removed with a cold snare. Complete resection.                         Polyp tissue not retrieved.                        - Four 3 to 4 mm polyps in the sigmoid colon and in                         the descending colon, removed with a cold snare.                         Resected and retrieved.                        - Diverticulosis in the recto-sigmoid colon, in the                         sigmoid colon and in the descending colon.                        - Non-bleeding external hemorrhoids.                        - The examined portion of the ileum was  normal. Recommendation:        - Discharge patient to home (with escort).                        - Resume previous diet today.                        - Continue present medications.                        - Await pathology results.                        - Repeat colonoscopy in 3 - 5 years for surveillance                         based on pathology results. Procedure Code(s):     --- Professional ---                        [phone], Colonoscopy, flexible; with removal of                         tumor(s), polyp(s), or other lesion(s) by snare                         technique Diagnosis Code(s):     --- Professional ---                        Z12.11, Encounter for screening for malignant neoplasm                         of  colon                        D12.2, Benign neoplasm of ascending colon                        D12.5, Benign neoplasm of sigmoid colon                        D12.4, Benign neoplasm of descending colon                        K57.30, Diverticulosis of large intestine without                         perforation or abscess without bleeding                        K64.4, Residual hemorrhoidal skin tags CPT copyright 2022 American Medical Association. All rights reserved. The codes documented in this report are preliminary and upon coder review may  be revised to meet current compliance requirements. Dr. Libby Maw Toney Reil MD, MD 07/23/2023 12:07:42 PM This report has been signed electronically. Number of Addenda: 0 Note Initiated On: 07/23/2023 11:31 AM Scope Withdrawal Time: 0 hours 14 minutes 2 seconds  Total Procedure Duration: 0 hours 16 minutes 12 seconds  Estimated Blood Loss:  Estimated blood loss: none. Estimated blood loss: none.      [facility]

## 2023-07-23 NOTE — Anesthesia Preprocedure Evaluation (Addendum)
 Anesthesia Evaluation  Patient identified by MRN, date of birth, ID band Patient awake    Reviewed: Allergy & Precautions, H&P , NPO status , Patient's Chart, lab work & pertinent test results  Airway Mallampati: II  TM Distance: >3 FB Neck ROM: Full    Dental no notable dental hx.    Pulmonary neg pulmonary ROS   Pulmonary exam normal breath sounds clear to auscultation       Cardiovascular negative cardio ROS Normal cardiovascular exam Rhythm:Regular Rate:Normal     Neuro/Psych  Neuromuscular disease negative neurological ROS  negative psych ROS   GI/Hepatic negative GI ROS, Neg liver ROS,,,  Endo/Other  negative endocrine ROSHypothyroidism    Renal/GU negative Renal ROS  negative genitourinary   Musculoskeletal negative musculoskeletal ROS (+)    Abdominal   Peds negative pediatric ROS (+)  Hematology negative hematology ROS (+)   Anesthesia Other Findings Hypothyroid  Obese Multiple sclerosis (HCC)  H/O shoulder surgery Scaly skin  Urinary incontinence in female Recurrent UTI  Multiple body piercing Patient is angry that she needs to have urine specimen for HCG. Unfortunately, that is the rule here at [facility].    Reproductive/Obstetrics negative OB ROS                             Anesthesia Physical Anesthesia Plan  ASA: 3  Anesthesia Plan: General   Post-op Pain Management:    Induction: Intravenous  PONV Risk Score and Plan:   Airway Management Planned: Natural Airway and Nasal Cannula  Additional Equipment:   Intra-op Plan:   Post-operative Plan:   Informed Consent: I have reviewed the patients History and Physical, chart, labs and discussed the procedure including the risks, benefits and alternatives for the proposed anesthesia with the patient or authorized representative who has indicated his/her understanding and acceptance.     Dental Advisory  Given  Plan Discussed with: Anesthesiologist, CRNA and Surgeon  Anesthesia Plan Comments: (Patient consented for risks of anesthesia including but not limited to:  - adverse reactions to medications - risk of airway placement if required - damage to eyes, teeth, lips or other oral mucosa - nerve damage due to positioning  - sore throat or hoarseness - Damage to heart, brain, nerves, lungs, other parts of body or loss of life  Patient voiced understanding and assent.)        Anesthesia Quick Evaluation

## 2023-07-23 NOTE — Transfer of Care (Signed)
 Immediate Anesthesia Transfer of Care Note  Patient: Samantha [name]  Procedure(s) Performed: COLONOSCOPY WITH BIOPSY (Rectum) POLYPECTOMY, INTESTINE (Rectum)  Patient Location: PACU  Anesthesia Type: General  Level of Consciousness: awake, alert  and patient cooperative  Airway and Oxygen Therapy: Patient Spontanous Breathing and Patient connected to supplemental oxygen  Post-op Assessment: Post-op Vital signs reviewed, Patient's Cardiovascular Status Stable, Respiratory Function Stable, Patent Airway and No signs of Nausea or vomiting  Post-op Vital Signs: Reviewed and stable  Complications: No notable events documented.

## 2023-07-23 NOTE — Anesthesia Postprocedure Evaluation (Signed)
 Anesthesia Post Note  Patient: Samantha [name]  Procedure(s) Performed: COLONOSCOPY WITH BIOPSY (Rectum) POLYPECTOMY, INTESTINE (Rectum)  Patient location during evaluation: PACU Anesthesia Type: General Level of consciousness: awake and alert Pain management: pain level controlled Vital Signs Assessment: post-procedure vital signs reviewed and stable Respiratory status: spontaneous breathing, nonlabored ventilation, respiratory function stable and patient connected to nasal cannula oxygen Cardiovascular status: blood pressure returned to baseline and stable Postop Assessment: no apparent nausea or vomiting Anesthetic complications: no   No notable events documented.   Last Vitals:  Vitals:   07/23/23 1207 07/23/23 1217  BP: 107/80 (!) 124/91  Pulse: 92 91  Resp: 18 14  Temp: 36.7 C   SpO2: 95% 97%    Last Pain:  Vitals:   07/23/23 1217  TempSrc:   PainSc: 0-No pain                 [name] C Kevante Lunt

## 2023-07-23 NOTE — H&P (Signed)
 Arlyss Repress, MD [address]  [address]  [place], [place] [address]  Main: [phone]  Fax: [phone] Pager: [phone]  Primary Care Physician:  [name], FNP Primary Gastroenterologist:  Dr. Arlyss Repress  Pre-Procedure History & Physical: HPI:  Samantha [name] is a 53 y.o. female is here for an colonoscopy.   Past Medical History:  Diagnosis Date   H/O shoulder surgery    Hypothyroid    Multiple body piercings    pt states she has acrylic items in piercings   Multiple sclerosis (HCC)    Obese    Recurrent UTI 07/11/2013   Scaly skin 07/22/2015   Urinary incontinence in female 05/05/2016    Past Surgical History:  Procedure Laterality Date   BREAST BIOPSY Right    CARPAL TUNNEL RELEASE Right 01/11/2023   Procedure: RIGHT CARPAL TUNNEL RELEASE;  Surgeon: Betha Loa, MD;  [facility]:  [facility];  Service: Orthopedics;  Laterality: Right;  30 MIN   CARPAL TUNNEL RELEASE Left 03/2023   SHOULDER ARTHROSCOPY Left    THYROIDECTOMY     TONSILLECTOMY     TUBAL LIGATION      Prior to Admission medications   Medication Sig Start Date End Date Taking? Authorizing Provider  b complex vitamins capsule Take 1 capsule by mouth daily.   Yes [provider]  calcium carbonate (OSCAL) 1500 (600 Ca) MG TABS tablet Take by mouth 2 (two) times daily with a meal.   Yes [provider]  cyclobenzaprine (FLEXERIL) 5 MG tablet Take 1 tablet (5 mg total) by mouth at bedtime. 04/21/23  Yes Sater, Pearletha Furl, MD  levothyroxine (SYNTHROID) 200 MCG tablet Take 1 tablet (200 mcg total) by mouth daily before breakfast. 06/16/23  Yes [name], FNP  modafinil (PROVIGIL) 200 MG tablet One po qd 04/21/23  Yes Sater, Pearletha Furl, MD  Multiple Vitamin (MULTIVITAMIN) capsule Take by mouth.   Yes [provider]  nortriptyline (PAMELOR) 25 MG capsule One po qd 04/21/23  Yes Sater, Pearletha Furl, MD  Ocrelizumab (OCREVUS IV) Inject into the vein.  Every 6 mths   Yes [provider]  Vitamin D, Ergocalciferol, (DRISDOL) 1.25 MG (50000 UNIT) CAPS capsule Take 1 capsule (50,000 Units total) by mouth every 7 (seven) days. 04/21/23  Yes Sater, Pearletha Furl, MD  levonorgestrel (MIRENA) 20 MCG/DAY IUD 1 each by Intrauterine route once.    [provider]  Na Sulfate-K Sulfate-Mg Sulfate concentrate (SUPREP) 17.5-3.13-1.6 GM/177ML SOLN  06/30/23   [provider]    Allergies as of 06/25/2023 - Review Complete 06/25/2023  Allergen Reaction Noted   Celebrex [celecoxib] Hives and Itching 06/09/2023    Family History  Problem Relation Age of Onset   Ovarian cancer Mother    Breast cancer Mother 66 - 79   Cervical cancer Mother     Social History   Socioeconomic History   Marital status: Married    Spouse name: Not on file   Number of children: Not on file   Years of education: Not on file   Highest education level: Some college, no degree  Occupational History   Not on file  Tobacco Use   Smoking status: Never   Smokeless tobacco: Never  Vaping Use   Vaping status: Never Used  Substance and Sexual Activity   Alcohol use: Yes    Alcohol/week: 4.0 standard drinks of alcohol    Types: 4 Standard drinks or equivalent per week    Comment: social  Drug use: No   Sexual activity: Yes    Birth control/protection: I.U.D.    Comment: Mirena  Other Topics Concern   Not on file  Social History Narrative   Not on file   Social Drivers of Health   Financial Resource Strain: Low Risk  (06/08/2023)   Overall Financial Resource Strain (CARDIA)    Difficulty of Paying Living Expenses: Not hard at all  Food Insecurity: No Food Insecurity (06/08/2023)   Hunger Vital Sign    Worried About Running Out of Food in the Last Year: Never true    Ran Out of Food in the Last Year: Never true  Transportation Needs: No Transportation Needs (06/08/2023)   PRAPARE - Administrator, Civil Service (Medical): No     Lack of Transportation (Non-Medical): No  Physical Activity: Insufficiently Active (06/08/2023)   Exercise Vital Sign    Days of Exercise per Week: 3 days    Minutes of Exercise per Session: 40 min  Stress: No Stress Concern Present (06/08/2023)   [employer] of Occupational Health - Occupational Stress Questionnaire    Feeling of Stress : Not at all  Social Connections: Unknown (06/08/2023)   Social Connection and Isolation Panel [NHANES]    Frequency of Communication with Friends and Family: Once a week    Frequency of Social Gatherings with Friends and Family: Once a week    Attends Religious Services: Patient declined    Database administrator or Organizations: No    Attends Engineer, structural: Not on file    Marital Status: Married  Catering manager Violence: Not on file    Review of Systems: See HPI, otherwise negative ROS  Physical Exam: BP (!) 132/90   Pulse 79   Temp 98.4 F (36.9 C) (Temporal)   Resp 14   Ht 5\' 5"  (1.651 m)   Wt 112.4 kg   LMP  (LMP Unknown)   SpO2 98%   BMI 41.24 kg/m  General:   Alert,  pleasant and cooperative in NAD Head:  Normocephalic and atraumatic. Neck:  Supple; no masses or thyromegaly. Lungs:  Clear throughout to auscultation.    Heart:  Regular rate and rhythm. Abdomen:  Soft, nontender and nondistended. Normal bowel sounds, without guarding, and without rebound.   Neurologic:  Alert and  oriented x4;  grossly normal neurologically.  Impression/Plan: Samantha [name] is here for an colonoscopy to be performed for colon cancer screening  Risks, benefits, limitations, and alternatives regarding  colonoscopy have been reviewed with the patient.  Questions have been answered.  All parties agreeable.   Lannette Donath, MD  07/23/2023, 11:28 AM

## 2023-07-27 LAB — SURGICAL PATHOLOGY

## 2023-07-28 ENCOUNTER — Encounter: Payer: Self-pay | Admitting: Gastroenterology

## 2023-07-28 ENCOUNTER — Other Ambulatory Visit: Payer: Self-pay

## 2023-07-29 ENCOUNTER — Other Ambulatory Visit: Payer: Self-pay

## 2023-07-29 DIAGNOSIS — E039 Hypothyroidism, unspecified: Secondary | ICD-10-CM

## 2023-07-30 LAB — TSH+FREE T4
Free T4: 1.67 ng/dL (ref 0.82–1.77)
TSH: 2.66 u[IU]/mL (ref 0.450–4.500)

## 2023-08-02 ENCOUNTER — Ambulatory Visit: Admitting: Nurse Practitioner

## 2023-08-05 ENCOUNTER — Encounter: Payer: Self-pay | Admitting: Nurse Practitioner

## 2023-08-23 ENCOUNTER — Ambulatory Visit: Admitting: Nurse Practitioner

## 2023-08-23 ENCOUNTER — Encounter: Payer: Self-pay | Admitting: Internal Medicine

## 2023-08-23 ENCOUNTER — Ambulatory Visit: Payer: Self-pay | Admitting: *Deleted

## 2023-08-23 VITALS — BP 130/72 | HR 97 | Temp 98.6°F | Ht 65.0 in | Wt 253.2 lb

## 2023-08-23 DIAGNOSIS — R300 Dysuria: Secondary | ICD-10-CM | POA: Diagnosis not present

## 2023-08-23 DIAGNOSIS — M545 Low back pain, unspecified: Secondary | ICD-10-CM | POA: Diagnosis not present

## 2023-08-23 LAB — POCT URINALYSIS DIPSTICK
Bilirubin, UA: NEGATIVE
Blood, UA: NEGATIVE
Glucose, UA: NEGATIVE
Ketones, UA: NEGATIVE
Nitrite, UA: NEGATIVE
Protein, UA: NEGATIVE
Spec Grav, UA: 1.02 (ref 1.010–1.025)
Urobilinogen, UA: 0.2 U/dL
pH, UA: 7.5 (ref 5.0–8.0)

## 2023-08-23 MED ORDER — NITROFURANTOIN MONOHYD MACRO 100 MG PO CAPS: 100.0000 mg | ORAL_CAPSULE | Freq: Two times a day (BID) | ORAL | 0 refills | Status: AC

## 2023-08-23 NOTE — Telephone Encounter (Signed)
 Copied from CRM [id number]. Topic: Clinical - Red Word Triage >> Aug 23, 2023  9:00 AM Oddis Bench wrote: Red Word that prompted transfer to Nurse Triage: the patient is stating that she has pain in her lower back and burning while urinatiing Reason for Disposition  Age > 50 years  Answer Assessment - Initial Assessment Questions 1. SEVERITY: "How bad is the pain?"  (e.g., Scale 1-10; mild, moderate, or severe)   - MILD (1-3): complains slightly about urination hurting   - MODERATE (4-7): interferes with normal activities     - SEVERE (8-10): excruciating, unwilling or unable to urinate because of the pain      I'm burning with urination and back pain.   I have MS.   I get back pain with UTI.    I'm having joint pain today.   The pain is across my whole lower back. 2. FREQUENCY: "How many times have you had painful urination today?"      Every time I urinate I'm burning 3. PATTERN: "Is pain present every time you urinate or just sometimes?"      Yes  4. ONSET: "When did the painful urination start?"      Started over the weekend probably Sat.   It started with joint pain.   5. FEVER: "Do you have a fever?" If Yes, ask: "What is your temperature, how was it measured, and when did it start?"     I never run a fever.   6. PAST UTI: "Have you had a urine infection before?" If Yes, ask: "When was the last time?" and "What happened that time?"      Yes 7. CAUSE: "What do you think is causing the painful urination?"  (e.g., UTI, scratch, Herpes sore)     UTI  8. OTHER SYMPTOMS: "Do you have any other symptoms?" (e.g., blood in urine, flank pain, genital sores, urgency, vaginal discharge)     Joint pain,  burning with urination.    I have MS so my symptoms are different 9. PREGNANCY: "Is there any chance you are pregnant?" "When was your last menstrual period?"     Not asked  Protocols used: Urination Pain - Female-A-AH  Chief Complaint: UTI   burning with urination Symptoms: back pain   Has MS  so symptoms are different Frequency: Started Sat. Pertinent Negatives: Patient denies blood in urine or fever, no odor or cloudiness Disposition: [] ED /[] Urgent Care (no appt availability in office) / [x] Appointment(In office/virtual)/ []  [facility]/ [] Home Care/ [] Refused Recommended Disposition /[] [facility]/ []  Follow-up with PCP Additional Notes: Appt made

## 2023-08-23 NOTE — Patient Instructions (Signed)

## 2023-08-23 NOTE — Progress Notes (Signed)
 I,Victoria T Basil Lim, CMA,acting as a Neurosurgeon for Susanna Epley, FNP.,have documented all relevant documentation on the behalf of Susanna Epley, FNP,as directed by  Susanna Epley, FNP while in the presence of Susanna Epley, FNP.  Subjective:  Patient ID: Samantha [name] , female    DOB: [birth year] , 53 y.o.   MRN: [id number]  Chief Complaint  Patient presents with   Urinary Tract Infection    Patient presents today for possible UTI. She experiences burning while urinating, back pain. Pt states it hasn't been a lot due to her MS. Symptoms started with muscle pains and aches since last Thursday 08/19/2023 then back started hurting over the weekend.     HPI  Normally when she has urinary tract infections she will have joint and muscle aches. She has not had as much this afternoon. She was also discontinued from her bladder medications.   Urinary Tract Infection  This is a new problem. The current episode started in the past 7 days. The problem occurs every urination. There has been no fever. She is Not sexually active. There is No history of pyelonephritis. Associated symptoms include flank pain. Pertinent negatives include no chills, frequency, hematuria or nausea. She has tried increased fluids for the symptoms. Her past medical history is significant for recurrent UTIs.     Past Medical History:  Diagnosis Date   H/O shoulder surgery    Hypothyroid    Multiple body piercings    pt states she has acrylic items in piercings   Multiple sclerosis (HCC)    Obese    Recurrent UTI 07/11/2013   Scaly skin 07/22/2015   Urinary incontinence in female 05/05/2016     Family History  Problem Relation Age of Onset   Ovarian cancer Mother    Breast cancer Mother 63 - 65   Cervical cancer Mother      Current Outpatient Medications:    b complex vitamins capsule, Take 1 capsule by mouth daily., Disp: , Rfl:    calcium carbonate (OSCAL) 1500 (600 Ca) MG TABS tablet, Take by mouth 2 (two) times  daily with a meal., Disp: , Rfl:    cyclobenzaprine  (FLEXERIL ) 5 MG tablet, Take 1 tablet (5 mg total) by mouth at bedtime., Disp: 90 tablet, Rfl: 4   levonorgestrel (MIRENA) 20 MCG/DAY IUD, 1 each by Intrauterine route once., Disp: , Rfl:    levothyroxine  (SYNTHROID ) 200 MCG tablet, Take 1 tablet (200 mcg total) by mouth daily before breakfast., Disp: 90 tablet, Rfl: 1   modafinil  (PROVIGIL ) 200 MG tablet, One po qd, Disp: 30 tablet, Rfl: 5   Multiple Vitamin (MULTIVITAMIN) capsule, Take by mouth., Disp: , Rfl:    nitrofurantoin , macrocrystal-monohydrate, (MACROBID ) 100 MG capsule, Take 1 capsule (100 mg total) by mouth 2 (two) times daily for 5 days., Disp: 10 capsule, Rfl: 0   nortriptyline  (PAMELOR ) 25 MG capsule, One po qd, Disp: 90 capsule, Rfl: 4   Ocrelizumab (OCREVUS IV), Inject into the vein. Every 6 mths, Disp: , Rfl:    Vitamin D , Ergocalciferol , (DRISDOL ) 1.25 MG (50000 UNIT) CAPS capsule, Take 1 capsule (50,000 Units total) by mouth every 7 (seven) days., Disp: 13 capsule, Rfl: 1   Allergies  Allergen Reactions   Celebrex [Celecoxib] Hives and Itching     Review of Systems  Constitutional: Negative.  Negative for chills.  Respiratory: Negative.    Cardiovascular: Negative.   Gastrointestinal:  Negative for nausea.  Genitourinary:  Positive for flank pain. Negative for frequency and hematuria.  Neurological: Negative.   Psychiatric/Behavioral: Negative.       Today's Vitals   08/23/23 1528  BP: 130/72  Pulse: 97  Temp: 98.6 F (37 C)  SpO2: 98%  Weight: 253 lb 3.2 oz (114.9 kg)  Height: 5\' 5"  (1.651 m)   Body mass index is 42.13 kg/m.  Wt Readings from Last 3 Encounters:  08/23/23 253 lb 3.2 oz (114.9 kg)  07/23/23 247 lb 12.8 oz (112.4 kg)  07/15/23 254 lb (115.2 kg)     Objective:  Physical Exam Vitals and nursing note reviewed.  Constitutional:      General: She is not in acute distress.    Appearance: Normal appearance.  Cardiovascular:     Rate and  Rhythm: Normal rate and regular rhythm.     Pulses: Normal pulses.     Heart sounds: Normal heart sounds. No murmur heard. Pulmonary:     Effort: Pulmonary effort is normal. No respiratory distress.     Breath sounds: Normal breath sounds.  Abdominal:     Tenderness: There is right CVA tenderness. There is no left CVA tenderness.  Skin:    General: Skin is warm and dry.     Capillary Refill: Capillary refill takes less than 2 seconds.  Neurological:     General: No focal deficit present.     Mental Status: She is alert and oriented to person, place, and time.     Cranial Nerves: No cranial nerve deficit.     Motor: No weakness.  Psychiatric:        Mood and Affect: Mood normal.        Behavior: Behavior normal.        Thought Content: Thought content normal.        Judgment: Judgment normal.     Assessment And Plan:  Dysuria Assessment & Plan: Urinalysis is positive for trace leukocytes. Will send urine culture.   Orders: -     POCT urinalysis dipstick -     Urine Culture -     Nitrofurantoin  Monohyd Macro; Take 1 capsule (100 mg total) by mouth 2 (two) times daily for 5 days.  Dispense: 10 capsule; Refill: 0  Acute bilateral low back pain without sciatica Assessment & Plan: Positive cva tenderness to right CVA. Will treat with nitrofuratoin due to symptoms and history of MS.   Orders: -     POCT urinalysis dipstick -     Nitrofurantoin  Monohyd Macro; Take 1 capsule (100 mg total) by mouth 2 (two) times daily for 5 days.  Dispense: 10 capsule; Refill: 0    Return if symptoms worsen or fail to improve.  Patient was given opportunity to ask questions. Patient verbalized understanding of the plan and was able to repeat key elements of the plan. All questions were answered to their satisfaction.  Susanna Epley, FNP  I, Susanna Epley, FNP, have reviewed all documentation for this visit. The documentation on 08/23/23 for the exam, diagnosis, procedures, and orders are all  accurate and complete.   IF YOU HAVE BEEN REFERRED TO A SPECIALIST, IT MAY TAKE 1-2 WEEKS TO SCHEDULE/PROCESS THE REFERRAL. IF YOU HAVE NOT HEARD FROM US /SPECIALIST IN TWO WEEKS, PLEASE GIVE US  A CALL AT [phone].   THE PATIENT IS ENCOURAGED TO PRACTICE SOCIAL DISTANCING DUE TO THE COVID-19 PANDEMIC.

## 2023-08-23 NOTE — Assessment & Plan Note (Signed)
 Positive cva tenderness to right CVA. Will treat with nitrofuratoin due to symptoms and history of MS.

## 2023-08-23 NOTE — Assessment & Plan Note (Signed)
 Urinalysis is positive for trace leukocytes. Will send urine culture.

## 2023-08-31 DIAGNOSIS — M1711 Unilateral primary osteoarthritis, right knee: Secondary | ICD-10-CM | POA: Insufficient documentation

## 2023-09-29 ENCOUNTER — Other Ambulatory Visit: Payer: Self-pay | Admitting: Neurology

## 2023-09-29 ENCOUNTER — Other Ambulatory Visit: Payer: Self-pay

## 2023-10-11 ENCOUNTER — Telehealth: Payer: Self-pay | Admitting: *Deleted

## 2023-10-11 NOTE — Telephone Encounter (Signed)
 Per [facility], pt Ocrevus order expiring 10/20/23. Needed updated order. Provided updated order below to them.

## 2023-10-31 ENCOUNTER — Other Ambulatory Visit: Payer: Self-pay | Admitting: Neurology

## 2023-10-31 DIAGNOSIS — S63501A Unspecified sprain of right wrist, initial encounter: Secondary | ICD-10-CM | POA: Insufficient documentation

## 2023-11-01 NOTE — Telephone Encounter (Signed)
 Last seen on 04/21/23 Follow up scheduled on 11/03/23   Dispensed Days Supply Quantity Provider Pharmacy  MODAFINIL   200 MG TABS 09/30/2023 30 30 tablet Sater, Charlie LABOR, MD [facility] [phone] - [facility]..     Rx pending to be signed

## 2023-11-02 NOTE — Progress Notes (Unsigned)
 [facility]  PATIENT: Samantha [name] DOB: 07/26/1970  REFERRING DOCTOR OR PCP: Rosina Gaucher, PA-C SOURCE: Patient, notes from [facility] (Dr. Jasmine), imaging and laboratory reports, MRI images ere personally reviewed _________________________________  HISTORICAL  CHIEF COMPLAINT:  Chief Complaint  Patient presents with   RM16/MS    Pt is here Alone. Pt states that she has some pain that comes and goes. Pt states that she needs her bladder medication refilled.    HISTORY OF PRESENT ILLNESS:  [name] is a 53 y.o. 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 Mentions hiccup, when eating sometimes food feels like it gets stuck. Will actually vomit, drinks a lot of water  to try and get it down. Going on at least a year, has become more often, in the last 3 weeks has done it twice. She isn't sure if MS related. On Ocreuvs, next infusion is Tuesday. Wanted to ask about blood test for RA, has stiff joints. CTA surgery both hands last year. Walking and balance is okay, depends on if sleep deprived. No falls, has been good at catching herself. 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 Vision is fine. UTI is May, more urgency, would like to go back on oxybutynin . Lives alone, her kids are grown. Remains on Flexeril , Provigil , Nortriptyline . Has spasticity in her legs, works it out with massage gun. MRI brain stable in Jan 2025. IGG IGM IGA were normal. Gets cortisone shots to her knees.   Update 04/21/2023 She is currently on Ocrevus.  Next infusion is 05/10/2023.   She is tolerating it well.  When checked December 2023, IgG/IgM was normal.  CD19/20 were 0.5.   She feels some wearing off the last month of each cycle  She had bilateral CTR (first riht and recently left - will get stitches out in a couple days).    Gait is doing ok though balance is reduced and she  needs rails on the stairs.    She feels strength is symmetric.  No foot drop.  She has some stumbles..     Mild leg dyseshesias.  She has some hand numbness felt to e due to CTS.    She has a T2 hyperintense focus at C2.  She notes more stiffness and pain in legs if she sits a long time.   This is also painful when she stands up.  She has spasms in her leg muscles, R>L but had no benefit from balcofen or tizanidine  so she stopped. This bothers her more at night.   .  Nortriptyline  may help dysesthesias some  She has urinary urgency only helped a bit by oxybutynin  so she stopped  Vision is ok - needs to use reading glasses more.     She has increased stress but denies depression.  She works third shift (11 pm to 7am) and sleeps poorly    Cognition is usually ok but has some word finding difficulties.    She has been on Vit D 50000 U weekly.  Last level ws 71 and we discussed decreasing to 05-4998 U daily.    She had a NCV/EMG showing CTS.   She since has had bilateral CTR  MS history:  She was diagnosed with MS around 2012.   She woke up with numbness in her left face and tongue.    She had an MRI of the brain and was diagnosed with  multiple sclerosis.   She was diagnosed in [place], [place].  She received IV steroids and numbness improved over time.    She was placed on Tecfidera x a couple years but had a relapse and then was placed on Plegridy.   She had difficulty with injections so switched to Ocrevus in 2019.   Her MS has done well and she has not had any exacerbations.     She is currently getting infusions at [facility] in [facility], [place].     She has been seeing Dr. Jasmine the past 3 years at [facility] but insurance company is making her switch.   IMAGING: Personally reviewed today MRI of the brain 08/12/2020 shows multiple T2/Flair hyperintense foci in the periventrixular, juxtacotical and deep white matter and 2 foci in cerebellar hemisphere.   No enhancement    No change (by report) compared to  2020.  MRI of the cervical spine 03/17/2017 shows a T2 hyperintense focus at C2.   It was otherwise normal.  No enhancing lesions.  LABS: IgG was normal.  IgM borderline ([facility] at [facility]), Vit D was normal.  QuantiFERON TB and hepatitis chronic infection labs were normal in 2017.  REVIEW OF SYSTEMS: See HPI  ALLERGIES: Allergies  Allergen Reactions   Celebrex [Celecoxib] Hives and Itching    HOME MEDICATIONS:  Current Outpatient Medications:    b complex vitamins capsule, Take 1 capsule by mouth daily., Disp: , Rfl:    calcium carbonate (OSCAL) 1500 (600 Ca) MG TABS tablet, Take by mouth 2 (two) times daily with a meal., Disp: , Rfl:    cyclobenzaprine  (FLEXERIL ) 10 MG tablet, Take 1 tablet (10 mg total) by mouth at bedtime., Disp: 90 tablet, Rfl: 1   levonorgestrel (MIRENA) 20 MCG/DAY IUD, 1 each by Intrauterine route once., Disp: , Rfl:    levothyroxine  (SYNTHROID ) 200 MCG tablet, Take 1 tablet (200 mcg total) by mouth daily before breakfast., Disp: 90 tablet, Rfl: 1   modafinil  (PROVIGIL ) 200 MG tablet, TAKE 1 TABLET BY MOUTH EVERY DAY, Disp: 30 tablet, Rfl: 5   Multiple Vitamin (MULTIVITAMIN) capsule, Take by mouth., Disp: , Rfl:    nortriptyline  (PAMELOR ) 25 MG capsule, One po qd, Disp: 90 capsule, Rfl: 4   Ocrelizumab (OCREVUS IV), Inject into the vein. Every 6 mths, Disp: , Rfl:    oxybutynin  (DITROPAN  XL) 10 MG 24 hr tablet, Take 1 tablet (10 mg total) by mouth at bedtime., Disp: 30 tablet, Rfl: 5   Vitamin D , Ergocalciferol , (DRISDOL ) 1.25 MG (50000 UNIT) CAPS capsule, Take 1 capsule (50,000 Units total) by mouth every 7 (seven) days., Disp: 13 capsule, Rfl: 1  PAST MEDICAL HISTORY: Past Medical History:  Diagnosis Date   H/O shoulder surgery    Hypothyroid    Multiple body piercings    pt states she has acrylic items in piercings   Multiple sclerosis (HCC)    Obese    Recurrent UTI 07/11/2013   Scaly skin 07/22/2015   Urinary incontinence in female 05/05/2016    PAST  SURGICAL HISTORY: Past Surgical History:  Procedure Laterality Date   BREAST BIOPSY Right    CARPAL TUNNEL RELEASE Right 01/11/2023   Procedure: RIGHT CARPAL TUNNEL RELEASE;  Surgeon: Murrell Drivers, MD;  Location: [facility];  Service: Orthopedics;  Laterality: Right;  30 MIN   CARPAL TUNNEL RELEASE Left 03/2023   COLONOSCOPY N/A 07/23/2023   Procedure: COLONOSCOPY WITH BIOPSY;  Surgeon: Unk Corinn Skiff, MD;  Location: [facility];  Service: Endoscopy;  Laterality: N/A;   POLYPECTOMY  07/23/2023   Procedure: POLYPECTOMY, INTESTINE;  Surgeon: Unk Corinn Skiff, MD;  Location: [facility];  Service: Endoscopy;;  Ascending Colon Polyp removed but not retrieved   SHOULDER ARTHROSCOPY Left    THYROIDECTOMY     TONSILLECTOMY     TUBAL LIGATION      FAMILY HISTORY: Family History  Problem Relation Age of Onset   Ovarian cancer Mother    Breast cancer Mother 15 - 100   Cervical cancer Mother     SOCIAL HISTORY:  Social History   Socioeconomic History   Marital status: Married    Spouse name: Not on file   Number of children: Not on file   Years of education: Not on file   Highest education level: Some college, no degree  Occupational History   Not on file  Tobacco Use   Smoking status: Never   Smokeless tobacco: Never  Vaping Use   Vaping status: Never Used  Substance and Sexual Activity   Alcohol use: Yes    Alcohol/week: 4.0 standard drinks of alcohol    Types: 4 Standard drinks or equivalent per week    Comment: social   Drug use: No   Sexual activity: Yes    Birth control/protection: I.U.D.    Comment: Mirena  Other Topics Concern   Not on file  Social History Narrative   Not on file   Social Drivers of Health   Financial Resource Strain: Low Risk  (06/08/2023)   Overall Financial Resource Strain (CARDIA)    Difficulty of Paying Living Expenses: Not hard at all  Food Insecurity: No Food Insecurity (06/08/2023)   Hunger Vital  Sign    Worried About Running Out of Food in the Last Year: Never true    Ran Out of Food in the Last Year: Never true  Transportation Needs: No Transportation Needs (06/08/2023)   PRAPARE - Administrator, Civil Service (Medical): No    Lack of Transportation (Non-Medical): No  Physical Activity: Insufficiently Active (06/08/2023)   Exercise Vital Sign    Days of Exercise per Week: 3 days    Minutes of Exercise per Session: 40 min  Stress: No Stress Concern Present (06/08/2023)   [employer] of Occupational Health - Occupational Stress Questionnaire    Feeling of Stress : Not at all  Social Connections: Unknown (06/08/2023)   Social Connection and Isolation Panel    Frequency of Communication with Friends and Family: Once a week    Frequency of Social Gatherings with Friends and Family: Once a week    Attends Religious Services: Patient declined    Database administrator or Organizations: No    Attends Engineer, structural: Not on file    Marital Status: Married  Catering manager Violence: Not on file   PHYSICAL EXAM  Vitals:   11/03/23 0833  BP: 137/83  Pulse: (!) 46  SpO2: 99%  Weight: 240 lb 12.8 oz (109.2 kg)  Height: 5' 5 (1.651 m)   Physical Exam  General: The patient is alert and cooperative at the time of the examination.  Skin: No significant peripheral edema is noted.  Neurologic Exam  Mental status: The patient is alert and oriented x 3 at the time of the examination. The patient has apparent normal recent and remote memory, with an apparently normal attention span and concentration ability.  Cranial nerves: Facial symmetry is present. Speech is normal, no aphasia or  dysarthria is noted. Extraocular movements are full. Visual fields are full.  Motor: The patient has good strength in all 4 extremities. Pain to right wrist. Tone slightly increased in legs.   Sensory examination: Soft touch sensation is symmetric on the face, arms, and  legs.  Coordination: The patient has good finger-nose-finger and heel-to-shin bilaterally.  Gait and station: The patient has a normal gait.   Reflexes: Deep tendon reflexes are symmetric.     Lab Results  Component Value Date   TSH 2.660 07/29/2023       ASSESSMENT AND PLAN  Multiple sclerosis (HCC) - Plan: SLP modified barium swallow, CBC with Differential/Platelet, IgG, IgA, IgM, Cyclic citrul peptide antibody, IgG, Rheumatoid factor, Vitamin D , 25-hydroxy  - Continue Ocrevus, infusion is next week - Increase Flexeril  10 mg at bedtime for muscle spasms - Restart oxybutynin  XL for bladder urgency  - Check CBC, IGG IGA IGM, Vitamin D  - Barium swallow study for report of dysphagia with vomiting, also discuss with PCP - Requesting labs for RA given history of underlying autoimmune disease, significant muscle/joint aches and pain - Exercise, stay active  - Continue Provigil , nortriptyline  - Follow up in 6 months    Orders Placed This Encounter  Procedures   CBC with Differential/Platelet   IgG, IgA, IgM   Rheumatoid factor   Vitamin D , 25-hydroxy   CYCLIC CITRUL PEPTIDE ANTIBODY, IGG/IGA   SLP modified barium swallow   Meds ordered this encounter  Medications   oxybutynin  (DITROPAN  XL) 10 MG 24 hr tablet    Sig: Take 1 tablet (10 mg total) by mouth at bedtime.    Dispense:  30 tablet    Refill:  5   cyclobenzaprine  (FLEXERIL ) 10 MG tablet    Sig: Take 1 tablet (10 mg total) by mouth at bedtime.    Dispense:  90 tablet    Refill:  1   Lauraine Gayland MANDES, DNP  [facility] [address], [address] [place], [place] [address] [phone]

## 2023-11-03 ENCOUNTER — Other Ambulatory Visit: Payer: Self-pay | Admitting: Neurology

## 2023-11-03 ENCOUNTER — Encounter: Payer: Self-pay | Admitting: Neurology

## 2023-11-03 ENCOUNTER — Ambulatory Visit: Payer: 59 | Admitting: Neurology

## 2023-11-03 VITALS — BP 137/83 | HR 46 | Ht 65.0 in | Wt 240.8 lb

## 2023-11-03 DIAGNOSIS — G35 Multiple sclerosis: Secondary | ICD-10-CM | POA: Diagnosis not present

## 2023-11-03 DIAGNOSIS — M792 Neuralgia and neuritis, unspecified: Secondary | ICD-10-CM

## 2023-11-03 DIAGNOSIS — R261 Paralytic gait: Secondary | ICD-10-CM

## 2023-11-03 DIAGNOSIS — R3915 Urgency of urination: Secondary | ICD-10-CM

## 2023-11-03 DIAGNOSIS — R208 Other disturbances of skin sensation: Secondary | ICD-10-CM

## 2023-11-03 MED ORDER — CYCLOBENZAPRINE HCL 10 MG PO TABS: 10.0000 mg | ORAL_TABLET | Freq: Every day | ORAL | 1 refills | Status: DC

## 2023-11-03 MED ORDER — OXYBUTYNIN CHLORIDE ER 10 MG PO TB24: 10.0000 mg | ORAL_TABLET | Freq: Every day | ORAL | 5 refills | Status: DC

## 2023-11-03 NOTE — Patient Instructions (Signed)
 Great to see you today! Check labs today  Check Barium swallow study, discuss with your primary care as well Stay active, exercise   Meds ordered this encounter  Medications   oxybutynin  (DITROPAN  XL) 10 MG 24 hr tablet    Sig: Take 1 tablet (10 mg total) by mouth at bedtime.    Dispense:  30 tablet    Refill:  5   cyclobenzaprine  (FLEXERIL ) 10 MG tablet    Sig: Take 1 tablet (10 mg total) by mouth at bedtime.    Dispense:  90 tablet    Refill:  1

## 2023-11-04 ENCOUNTER — Ambulatory Visit: Payer: Self-pay | Admitting: Neurology

## 2023-11-04 ENCOUNTER — Other Ambulatory Visit (HOSPITAL_COMMUNITY): Payer: Self-pay | Admitting: Neurology

## 2023-11-04 DIAGNOSIS — R1310 Dysphagia, unspecified: Secondary | ICD-10-CM

## 2023-11-04 LAB — CBC WITH DIFFERENTIAL/PLATELET
Basophils Absolute: 0.1 x10E3/uL (ref 0.0–0.2)
Basos: 1 %
EOS (ABSOLUTE): 0.6 x10E3/uL — ABNORMAL HIGH (ref 0.0–0.4)
Eos: 6 %
Hematocrit: 44.3 % (ref 34.0–46.6)
Hemoglobin: 14 g/dL (ref 11.1–15.9)
Immature Grans (Abs): 0 x10E3/uL (ref 0.0–0.1)
Immature Granulocytes: 0 %
Lymphocytes Absolute: 1.5 x10E3/uL (ref 0.7–3.1)
Lymphs: 13 %
MCH: 29.7 pg (ref 26.6–33.0)
MCHC: 31.6 g/dL (ref 31.5–35.7)
MCV: 94 fL (ref 79–97)
Monocytes Absolute: 0.9 x10E3/uL (ref 0.1–0.9)
Monocytes: 8 %
Neutrophils Absolute: 8 x10E3/uL — ABNORMAL HIGH (ref 1.4–7.0)
Neutrophils: 72 %
Platelets: 315 x10E3/uL (ref 150–450)
RBC: 4.72 x10E6/uL (ref 3.77–5.28)
RDW: 12.9 % (ref 11.7–15.4)
WBC: 11 x10E3/uL — ABNORMAL HIGH (ref 3.4–10.8)

## 2023-11-04 LAB — IGG, IGA, IGM
IgA/Immunoglobulin A, Serum: 133 mg/dL (ref 87–352)
IgG (Immunoglobin G), Serum: 762 mg/dL (ref 586–1602)
IgM (Immunoglobulin M), Srm: 43 mg/dL (ref 26–217)

## 2023-11-04 LAB — VITAMIN D 25 HYDROXY (VIT D DEFICIENCY, FRACTURES): Vit D, 25-Hydroxy: 83.2 ng/mL (ref 30.0–100.0)

## 2023-11-04 LAB — RHEUMATOID FACTOR: Rheumatoid fact SerPl-aCnc: <10 [IU]/mL (ref <14.0)

## 2023-11-05 LAB — CYCLIC CITRUL PEPTIDE ANTIBODY, IGG/IGA: Cyclic Citrullin Peptide Ab: 6 U (ref 0–19)

## 2023-11-08 ENCOUNTER — Encounter: Payer: Self-pay | Admitting: Nurse Practitioner

## 2023-11-17 ENCOUNTER — Ambulatory Visit (HOSPITAL_COMMUNITY)
Admission: RE | Admit: 2023-11-17 | Discharge: 2023-11-17 | Disposition: A | Discharge status: Home / Self Care | Source: Ambulatory Visit | Attending: Nurse Practitioner | Admitting: Nurse Practitioner

## 2023-11-17 ENCOUNTER — Ambulatory Visit: Payer: Self-pay | Admitting: Neurology

## 2023-11-17 ENCOUNTER — Encounter (HOSPITAL_COMMUNITY): Payer: Self-pay | Admitting: Radiology

## 2023-11-17 DIAGNOSIS — G35 Multiple sclerosis: Secondary | ICD-10-CM | POA: Insufficient documentation

## 2023-11-17 DIAGNOSIS — R1310 Dysphagia, unspecified: Secondary | ICD-10-CM | POA: Diagnosis not present

## 2023-11-17 NOTE — Progress Notes (Signed)
 Modified Barium Swallow Study  Patient Details  Name: Samantha [name] MRN: [id number] Date of Birth: Oct 04, 1970  Today's Date: 11/17/2023  Modified Barium Swallow completed.  Full report located under Chart Review in the Imaging Section.  History of Present Illness Rafaelita Foister is a 53 y.o. woman with relapsing remitting multiple sclerosis diagnosed in 2012. When eating sometimes food feels like it gets stuck. Will actually vomit, drinks a lot of water  to try and get it down. Going on at least a year, has become more often, in the last 3 weeks has done it twice. She isn't sure if MS related.   Clinical Impression Pt demosntrates normal oral and oropharyngeal function. No significant weakness or aspiration to explain pts symptoms. Suspect pt has instances of esophageal dysphagia or spasm. Pt describes food lodging around sternal notch, being able to breathe, but subsequently regurgitating water  or food. Offered simple compensations and suggested referral to GI and esophagram. Esophageal sweep did show some distal stasis and retrograde movement of barium. Factors that may increase risk of adverse event in presence of aspiration Noe & Lianne 2021):    Swallow Evaluation Recommendations Recommendations: PO diet PO Diet Recommendation: Regular;Thin liquids (Level 0) Liquid Administration via: Cup;Straw Medication Administration: Whole meds with liquid Supervision: Patient able to self-feed Postural changes: Position pt fully upright for meals;Stay upright 30-60 min after meals Oral care recommendations: Pt independent with oral care Recommended consults: Consider GI consultation;Consider esophageal assessment      Koda Defrank, Consuelo Fitch 11/17/2023,3:26 PM

## 2023-12-02 ENCOUNTER — Other Ambulatory Visit: Payer: Self-pay | Admitting: Nurse Practitioner

## 2023-12-02 DIAGNOSIS — E039 Hypothyroidism, unspecified: Secondary | ICD-10-CM

## 2023-12-05 ENCOUNTER — Ambulatory Visit
Admission: RE | Admit: 2023-12-05 | Discharge: 2023-12-05 | Disposition: A | Discharge status: Home / Self Care | Attending: Emergency Medicine | Admitting: Emergency Medicine

## 2023-12-05 VITALS — BP 124/85 | HR 85 | Temp 98.0°F | Resp 19

## 2023-12-05 DIAGNOSIS — J069 Acute upper respiratory infection, unspecified: Secondary | ICD-10-CM | POA: Diagnosis not present

## 2023-12-05 MED ORDER — AZITHROMYCIN 250 MG PO TABS
250.0000 mg | ORAL_TABLET | Freq: Every day | ORAL | 0 refills | Status: AC
Start: 2023-12-05 — End: ?

## 2023-12-05 NOTE — ED Triage Notes (Addendum)
 Patient to Urgent Care with complaints of ear pain/ sore throat/ productive cough/ back pain from coughing/ fatigue. Denies any known fevers.   Symptoms x1 week  Meds: tylenol  cold and flu/ Dayquil/ nyquil

## 2023-12-05 NOTE — ED Provider Notes (Signed)
 Samantha [name]    CSN: [id number] Arrival date & time: 12/05/23  1228      History   Chief Complaint Chief Complaint  Patient presents with   Ear Fullness    Ears hurt, cough , throat is on fire - Entered by patient    HPI Samantha [name] is a 53 y.o. female.  Patient presents with 1 week history of ear pain, sore throat, congestion, cough, fatigue.  No fever or shortness of breath.  She has been treating her symptoms with OTC cold medication without relief.  The history is provided by the patient and medical records.    Past Medical History:  Diagnosis Date   H/O shoulder surgery    Hypothyroid    Multiple body piercings    pt states she has acrylic items in piercings   Multiple sclerosis (HCC)    Obese    Recurrent UTI 07/11/2013   Scaly skin 07/22/2015   Urinary incontinence in female 05/05/2016    Patient Active Problem List   Diagnosis Date Noted   Sprain of right wrist 10/31/2023   Osteoarthritis of right knee 08/31/2023   Dysuria 08/23/2023   Low back pain 08/23/2023   Polyp of colon 07/23/2023   Encounter for screening colonoscopy 06/20/2023   Annual physical exam 06/20/2023   COVID-19 vaccination declined 06/09/2023   Allergic reaction 06/09/2023   Hives 06/09/2023   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 adult (HCC) 12/20/2022   Hypothyroidism 12/20/2022   Elevated LDL cholesterol level 12/20/2022   Herpes zoster vaccination declined 12/20/2022   Need for influenza vaccination 12/20/2022   B12 deficiency 12/20/2022   Abnormal glucose 12/20/2022   Bilateral carpal tunnel syndrome 08/02/2022   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   Multiple sclerosis (HCC) 03/18/2021   Dysesthesia 03/18/2021   Urinary urgency 03/18/2021   Spastic gait 03/18/2021   Hypothyroidism, 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   Unilateral vocal cord paralysis 05/21/2014   Vitiligo 01/09/2014   Neurogenic bladder  07/11/2013   Neuropathic pain 01/17/2013   ANA positive 10/04/2012   Thyroid nodule 09/07/2012    Past Surgical History:  Procedure Laterality Date   BREAST BIOPSY Right    CARPAL TUNNEL RELEASE Right 01/11/2023   Procedure: RIGHT CARPAL TUNNEL RELEASE;  Surgeon: Murrell Drivers, MD;  Location: [facility];  Service: Orthopedics;  Laterality: Right;  30 MIN   CARPAL TUNNEL RELEASE Left 03/2023   COLONOSCOPY N/A 07/23/2023   Procedure: COLONOSCOPY WITH BIOPSY;  Surgeon: Unk Corinn Skiff, MD;  Location: [facility];  Service: Endoscopy;  Laterality: N/A;   POLYPECTOMY  07/23/2023   Procedure: POLYPECTOMY, INTESTINE;  Surgeon: Unk Corinn Skiff, MD;  Location: [facility];  Service: Endoscopy;;  Ascending Colon Polyp removed but not retrieved   SHOULDER ARTHROSCOPY Left    THYROIDECTOMY     TONSILLECTOMY     TUBAL LIGATION      OB History     Gravida  5   Para  5   Term  5   Preterm      AB      Living  5      SAB      IAB      Ectopic      Multiple      Live Births  5            Home Medications    Prior to Admission medications  Medication Sig Start Date End Date Taking? Authorizing Provider  azithromycin  (ZITHROMAX ) 250 MG tablet Take 1 tablet (250 mg total) by mouth daily. Take first 2 tablets together, then 1 every day until finished. 12/05/23  Yes Corlis Burnard DEL, NP  b complex vitamins capsule Take 1 capsule by mouth daily.    [provider]  calcium carbonate (OSCAL) 1500 (600 Ca) MG TABS tablet Take by mouth 2 (two) times daily with a meal.    [provider]  cyclobenzaprine  (FLEXERIL ) 10 MG tablet Take 1 tablet (10 mg total) by mouth at bedtime. 11/03/23   Gayland Lauraine PARAS, NP  levonorgestrel (MIRENA) 20 MCG/DAY IUD 1 each by Intrauterine route once.    [provider]  levothyroxine  (SYNTHROID ) 200 MCG tablet TAKE 1 TABLET (200 MCG TOTAL) BY MOUTH DAILY BEFORE BREAKFAST. 12/02/23   Georgina Speaks, FNP  modafinil  (PROVIGIL ) 200 MG tablet TAKE 1 TABLET BY MOUTH EVERY DAY 11/01/23   Sater, Charlie LABOR, MD  Multiple Vitamin (MULTIVITAMIN) capsule Take by mouth.    [provider]  nortriptyline  (PAMELOR ) 25 MG capsule One po qd 04/21/23   Sater, Charlie LABOR, MD  Ocrelizumab (OCREVUS IV) Inject into the vein. Every 6 mths    [provider]  oxybutynin  (DITROPAN  XL) 10 MG 24 hr tablet Take 1 tablet (10 mg total) by mouth at bedtime. 11/03/23   Gayland Lauraine PARAS, NP  Vitamin D , Ergocalciferol , (DRISDOL ) 1.25 MG (50000 UNIT) CAPS capsule Take 1 capsule (50,000 Units total) by mouth every 7 (seven) days. 04/21/23   Sater, Charlie LABOR, MD    Family History Family History  Problem Relation Age of Onset   Ovarian cancer Mother    Breast cancer Mother 67 - 4   Cervical cancer Mother     Social History Social History   Tobacco Use   Smoking status: Never   Smokeless tobacco: Never  Vaping Use   Vaping status: Never Used  Substance Use Topics   Alcohol use: Yes    Alcohol/week: 4.0 standard drinks of alcohol    Types: 4 Standard drinks or equivalent per week    Comment: social   Drug use: No     Allergies   Celebrex [celecoxib]   Review of Systems Review of Systems  Constitutional:  Negative for chills and fever.  HENT:  Positive for congestion, ear pain, postnasal drip and sore throat.   Respiratory:  Positive for cough. Negative for shortness of breath.      Physical Exam Triage Vital Signs ED Triage Vitals  Encounter Vitals Group     BP      Girls Systolic BP Percentile      Girls Diastolic BP Percentile      Boys Systolic BP Percentile      Boys Diastolic BP Percentile      Pulse      Resp      Temp      Temp src      SpO2      Weight      Height      Head Circumference      Peak Flow      Pain Score      Pain Loc      Pain Education      Exclude from Growth Chart    No data found.  Updated Vital Signs BP 124/85   Pulse 85   Temp 98  F (36.7 C)   Resp 19  LMP  (LMP Unknown)   SpO2 97%   Visual Acuity Right Eye Distance:   Left Eye Distance:   Bilateral Distance:    Right Eye Near:   Left Eye Near:    Bilateral Near:     Physical Exam Constitutional:      General: She is not in acute distress. HENT:     Right Ear: Tympanic membrane normal.     Left Ear: Tympanic membrane normal.     Nose: Rhinorrhea present.     Mouth/Throat:     Mouth: Mucous membranes are moist.     Pharynx: Oropharynx is clear.     Comments: PND Cardiovascular:     Rate and Rhythm: Normal rate and regular rhythm.     Heart sounds: Normal heart sounds.  Pulmonary:     Effort: Pulmonary effort is normal. No respiratory distress.     Breath sounds: Normal breath sounds.     Comments: Frequent cough Neurological:     Mental Status: She is alert.      UC Treatments / Results  Labs (all labs ordered are listed, but only abnormal results are displayed) Labs Reviewed - No data to display  EKG   Radiology No results found.  Procedures Procedures (including critical care time)  Medications Ordered in UC Medications - No data to display  Initial Impression / Assessment and Plan / UC Course  I have reviewed the triage vital signs and the nursing notes.  Pertinent labs & imaging results that were available during my care of the patient were reviewed by me and considered in my medical decision making (see chart for details).    Acute upper respiratory infection.  Afebrile and vital signs are stable.  Patient has been symptomatic for a week and is not improving with OTC treatment.  Treating today with Zithromax .  Education provided on upper respiratory infection.  Instructed patient to follow up with her PCP if her symptoms are not improving.  She agrees to plan of care.   Final Clinical Impressions(s) / UC Diagnoses   Final diagnoses:  Acute upper respiratory infection     Discharge Instructions      Take the  Zithromax  as directed.  Follow-up with your primary care provider if your symptoms are not improving.      ED Prescriptions     Medication Sig Dispense Auth. Provider   azithromycin  (ZITHROMAX ) 250 MG tablet Take 1 tablet (250 mg total) by mouth daily. Take first 2 tablets together, then 1 every day until finished. 6 tablet Corlis Burnard DEL, NP      PDMP not reviewed this encounter.   Corlis Burnard DEL, NP 12/05/23 1250

## 2023-12-05 NOTE — Discharge Instructions (Addendum)
 Take the Zithromax as directed.  Follow up with your primary care provider if your symptoms are not improving.

## 2023-12-08 ENCOUNTER — Telehealth: Payer: Self-pay | Admitting: Gastroenterology

## 2023-12-08 NOTE — Telephone Encounter (Signed)
 Good morning Dr. San   DOD of the AM   I received a call from this patient stating that she has a referral and is requesting to schedule an OV to discuss her episodes of coking and vomiting. Patient was seen with [facility] and had a recent colonoscopy done back in April of this year. Those records can be viewed in EP[facility]. Would you please advise on how to schedule this patient.   Thank you.

## 2024-03-13 ENCOUNTER — Encounter: Payer: Self-pay | Admitting: Neurology

## 2024-03-23 ENCOUNTER — Telehealth: Payer: Self-pay | Admitting: *Deleted

## 2024-03-23 NOTE — Telephone Encounter (Signed)
 SABRA

## 2024-03-23 NOTE — Telephone Encounter (Signed)
 Needing diagnosis codes for MS Ocrevus Start Form faxed to [phone].  G35.A RRMS.  This service form was expiring 03/20/2024. Needed updated one.  Fax confirmation received.

## 2024-05-02 ENCOUNTER — Other Ambulatory Visit: Payer: Self-pay | Admitting: Neurology

## 2024-05-02 NOTE — Telephone Encounter (Signed)
 Requested Prescriptions   Pending Prescriptions Disp Refills   modafinil  (PROVIGIL ) 200 MG tablet [Pharmacy Med Name: MODAFINIL  200 MG TABLET] 30 tablet     Sig: TAKE 1 TABLET BY MOUTH EVERY DAY   Last seen 11/03/23 Next appt 06/06/24 Dispenses   Dispensed Days Supply Quantity Provider Pharmacy  MODAFINIL   200 MG TABS 03/24/2024 30 30 tablet Sater, Charlie LABOR, MD [facility] [phone] - B...  MODAFINIL   200 MG TABS 02/17/2024 30 30 tablet Sater, Charlie LABOR, MD [facility] [phone] - B...  MODAFINIL   200 MG TABS 01/16/2024 30 30 tablet Sater, Charlie LABOR, MD [facility] [phone] - B...  MODAFINIL   200 MG TABS 12/15/2023 30 30 tablet Sater, Charlie LABOR, MD [facility] [phone] - B...  MODAFINIL   200 MG TABS 11/01/2023 30 30 tablet Sater, Charlie LABOR, MD [facility] [phone] - B...  MODAFINIL   200 MG TABS 09/30/2023 30 30 tablet Sater, Charlie LABOR, MD [facility] [phone] - B...  MODAFINIL   200 MG TABS 08/29/2023 30 30 tablet Sater, Charlie LABOR, MD [facility] [phone] - B...  MODAFINIL   200 MG TABS 07/29/2023 30 30 tablet Sater, Charlie LABOR, MD [facility] [phone] - B...  MODAFINIL   200 MG TABS 06/23/2023 30 30 tablet Sater, Charlie LABOR, MD [facility] [phone] - B...  MODAFINIL   200 MG TABS 05/21/2023 30 30 tablet Sater, Charlie LABOR, MD [facility] [phone] - B.[email or web address]

## 2024-05-04 ENCOUNTER — Other Ambulatory Visit: Payer: Self-pay | Admitting: Neurology

## 2024-05-05 ENCOUNTER — Other Ambulatory Visit: Payer: Self-pay | Admitting: Neurology

## 2024-06-06 ENCOUNTER — Ambulatory Visit: Admitting: Neurology

## 2024-06-20 ENCOUNTER — Encounter: Payer: Self-pay | Admitting: Nurse Practitioner
